# Patient Record
Sex: Female | Born: 2000 | Race: White | Hispanic: No | Marital: Single | Smoking: Never smoker
Health system: Southern US, Community
[De-identification: ages and names within clinical notes are randomized; demographics above are authoritative.]

## PROBLEM LIST (undated history)

## (undated) DIAGNOSIS — R630 Anorexia: Secondary | ICD-10-CM

## (undated) DIAGNOSIS — R001 Bradycardia, unspecified: Secondary | ICD-10-CM

## (undated) HISTORY — PX: NO PAST SURGERIES: SHX2092

---
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
## 2019-03-05 ENCOUNTER — Telehealth: Payer: Self-pay | Admitting: Family

## 2019-03-05 NOTE — Telephone Encounter (Signed)
Patient sent emails to myself as well as [name] FNP. Per Neysa Bonito, a telephone or webex visit needs to be scheduled for patient. Emailed patient back letting her know that Neysa Bonito recommends an appointment be scheduled, either via telephone or webex, which can be scheduled with me via phone or email. Requested an email back letting me know which she prefers.  -----Original Message----- From: [name] [email or web address]> Sent: Monday, March 05, 2019 9:31 AM To: Franchot Gallo [email or web address]> Subject: FW: Summer Camp Physical  I think we are running most of these communications through you  - is that what we did before?   I want to know if the camp is still on with everything going on.  Also would need forms directly from the camp.  Georges Mouse, MSN, FNP-C Adolescent Medicine Nurse Practitioner   Tim and [facility] for [facility]oles[facility] [address] [place], [place] [address] e [email or web address] p [phone] f  [phone]   -----Original Message----- From: [email or web address]> Sent: Monday, March 05, 2019 9:26 AM To: [name] [email or web address]> Subject: Summer Camp Physical  What requirements to I have to meet to pass my physical to go to camp this summer? Thank you Jacklynn Lewis

## 2019-03-06 ENCOUNTER — Telehealth: Payer: Self-pay | Admitting: Family

## 2019-03-06 NOTE — Telephone Encounter (Signed)
Mom called to schedule virtual visit. Called number on file, no answer, left VM to call office back.

## 2019-03-06 NOTE — Telephone Encounter (Signed)
Patient calling to make an appointment with [facility]. Please call her back at [phone].

## 2019-03-07 NOTE — Telephone Encounter (Signed)
Appointment made

## 2019-03-13 ENCOUNTER — Ambulatory Visit (INDEPENDENT_AMBULATORY_CARE_PROVIDER_SITE_OTHER): Payer: 59 | Admitting: Family

## 2019-03-13 ENCOUNTER — Other Ambulatory Visit: Payer: Self-pay

## 2019-03-13 ENCOUNTER — Encounter: Payer: Self-pay | Admitting: Family

## 2019-03-13 DIAGNOSIS — F5001 Anorexia nervosa, restricting type: Secondary | ICD-10-CM | POA: Diagnosis not present

## 2019-03-13 DIAGNOSIS — E440 Moderate protein-calorie malnutrition: Secondary | ICD-10-CM | POA: Diagnosis not present

## 2019-03-13 NOTE — Progress Notes (Signed)
Virtual Visit via Video Note  I connected with Angela [name] on 03/13/19 at  1:30 PM EDT by a video enabled telemedicine application and verified that I am speaking with the correct person using two identifiers.   I discussed the limitations of evaluation and management by telemedicine and the availability of in person appointments. The patient expressed understanding and agreed to proceed.  History of Present Illness: Mom: Angela [name] checked herself out after 3 days; she said she had it back. She bought a treadmill, wakes at [employer] and walks; walks around the neighborhood all the time; she eats egg whites and carrots. Mom reports that the camp is continuing for the summer as of now.   [phone] Angela [name]'s number  -didn't like being in treatment -eating more, mom has told her to eat more -feels better  -HR is in the 50s-60s usually  -never dizzy  -haven't started period, had spotting every 2-3 weeks   24 hr recall  -egg, almond milk, soy milk, almond, almond butter, almond milk, carrots, grapes, butternut squash  -not walking that much (3-4 miles/day) - is on a walk when we are on video chat    Observations/Objective: -Walking with phone and avoids eye contact with video visit  -several acne spots which looked picked at on her face  -palor   Assessment and Plan: -Advised Angela [name] to email the forms to Rowland Lathe for my review -Likely will need vitals and in-person assessment to complete   Follow Up Instructions: -I will call Angela [name] to follow up pending review of documents for summer camp   I discussed the assessment and treatment plan with the patient. The patient was provided an opportunity to ask questions and all were answered. The patient agreed with the plan and demonstrated an understanding of the instructions.   The patient was advised to call back or seek an in-person evaluation if the symptoms worsen or if the condition fails to improve as anticipated.  I provided 16  minutes of non-face-to-face time during this encounter.   Georges Mouse, NP

## 2019-03-20 ENCOUNTER — Encounter: Payer: Self-pay | Admitting: Family

## 2019-03-20 ENCOUNTER — Other Ambulatory Visit: Payer: Self-pay | Admitting: Family

## 2019-03-20 ENCOUNTER — Ambulatory Visit (INDEPENDENT_AMBULATORY_CARE_PROVIDER_SITE_OTHER): Payer: 59 | Admitting: Family

## 2019-03-20 ENCOUNTER — Other Ambulatory Visit: Payer: Self-pay

## 2019-03-20 DIAGNOSIS — F5001 Anorexia nervosa, restricting type: Secondary | ICD-10-CM

## 2019-03-20 DIAGNOSIS — F50019 Anorexia nervosa, restricting type, unspecified: Secondary | ICD-10-CM

## 2019-03-20 DIAGNOSIS — E440 Moderate protein-calorie malnutrition: Secondary | ICD-10-CM

## 2019-03-20 MED ORDER — OLANZAPINE 2.5 MG PO TABS: ORAL_TABLET | ORAL | 1 refills | Status: DC

## 2019-03-20 NOTE — Progress Notes (Signed)
Virtual Visit via Video Note  I connected with Angela [name]  on 03/20/19 at  9:00 AM EDT by a video enabled telemedicine application and verified that I am speaking with the correct person using two identifiers.   Location of patient/parent:   I discussed the limitations of evaluation and management by telemedicine and the availability of in person appointments.  I discussed that the purpose of this phone visit is to provide medical care while limiting exposure to the novel coronavirus.  The patient expressed understanding and agreed to proceed.  Reason for visit:  Disordered eating, Anorexia Nervosa - restricting type  History of Present Illness:  18 yo female with anorexia nervosa, restricting type. She left [facility] within the first few days of her arrival. She had one inpatient stay at [facility] floor for 6 days (08/08/18-08/14/18). She is    Observations/Objective:  She is on a walk and when I ask her to stop for our chat, she stops briefly then starts walking again. Of note, there is a bloody picked skin lesion between her eyebrows and one bleeding on R side of chin. Limited eye contact in the video. Blacked-out video (only audio) for the last 4 minutes of call once she returns to her home from her walk.   Assessment and Plan:  Mom called Dr. Donnie Coffin yesterday concerned that she was not eating and was running on treadmill too much  She feels she is eating and not exercising too much  She does not want to go back to hospital  She feels better than she did when she had to go to hospital before  She denies dizziness, headaches, n/v; no bingeing  Follow Up Instructions:  -Return to clinic tomorrow for labs -Reviewed SSRI and olanzapine use for DE  -She was agreeable to start olanzapine and also agreeable to have [name], LCSW call her for a therapy session    I discussed the assessment and treatment plan with the patient and/or parent/guardian. They were provided an  opportunity to ask questions and all were answered. They agreed with the plan and demonstrated an understanding of the instructions.   They were advised to call back or seek an in-person evaluation in the emergency room if the symptoms worsen or if the condition fails to improve as anticipated.  I provided 18 minutes of non-face-to-face time during this encounter. I was located in the office during this encounter.  Georges Mouse, NP

## 2019-03-21 ENCOUNTER — Encounter (HOSPITAL_COMMUNITY): Payer: Self-pay

## 2019-03-21 ENCOUNTER — Inpatient Hospital Stay (HOSPITAL_COMMUNITY)
Admission: AD | Admit: 2019-03-21 | Discharge: 2019-03-31 | DRG: 883 | Disposition: A | Discharge status: Home / Self Care | Payer: No Typology Code available for payment source | Source: Ambulatory Visit | Attending: Pediatrics | Admitting: Pediatrics

## 2019-03-21 ENCOUNTER — Encounter: Payer: Self-pay | Admitting: Family

## 2019-03-21 ENCOUNTER — Other Ambulatory Visit: Payer: Self-pay

## 2019-03-21 ENCOUNTER — Ambulatory Visit (INDEPENDENT_AMBULATORY_CARE_PROVIDER_SITE_OTHER): Payer: 59 | Admitting: Family

## 2019-03-21 VITALS — BP 83/59 | HR 59 | Ht 62.56 in | Wt 93.6 lb

## 2019-03-21 DIAGNOSIS — E440 Moderate protein-calorie malnutrition: Secondary | ICD-10-CM | POA: Diagnosis not present

## 2019-03-21 DIAGNOSIS — Q842 Other congenital malformations of hair: Secondary | ICD-10-CM

## 2019-03-21 DIAGNOSIS — F509 Eating disorder, unspecified: Secondary | ICD-10-CM | POA: Diagnosis not present

## 2019-03-21 DIAGNOSIS — F5001 Anorexia nervosa, restricting type: Secondary | ICD-10-CM | POA: Diagnosis not present

## 2019-03-21 DIAGNOSIS — Z68.41 Body mass index (BMI) pediatric, less than 5th percentile for age: Secondary | ICD-10-CM | POA: Diagnosis not present

## 2019-03-21 DIAGNOSIS — Z8659 Personal history of other mental and behavioral disorders: Secondary | ICD-10-CM

## 2019-03-21 DIAGNOSIS — E0781 Sick-euthyroid syndrome: Secondary | ICD-10-CM | POA: Diagnosis present

## 2019-03-21 DIAGNOSIS — R001 Bradycardia, unspecified: Secondary | ICD-10-CM | POA: Diagnosis not present

## 2019-03-21 DIAGNOSIS — I951 Orthostatic hypotension: Secondary | ICD-10-CM | POA: Diagnosis present

## 2019-03-21 DIAGNOSIS — D539 Nutritional anemia, unspecified: Secondary | ICD-10-CM | POA: Diagnosis present

## 2019-03-21 DIAGNOSIS — D7589 Other specified diseases of blood and blood-forming organs: Secondary | ICD-10-CM | POA: Diagnosis present

## 2019-03-21 DIAGNOSIS — D61818 Other pancytopenia: Secondary | ICD-10-CM | POA: Diagnosis present

## 2019-03-21 HISTORY — DX: Anorexia: R63.0

## 2019-03-21 HISTORY — DX: Bradycardia, unspecified: R00.1

## 2019-03-21 LAB — MAGNESIUM: Magnesium: 2.1 mg/dL (ref 1.7–2.4)

## 2019-03-21 LAB — POCT URINALYSIS DIPSTICK
Bilirubin, UA: NEGATIVE
Blood, UA: NEGATIVE
Glucose, UA: NEGATIVE
Ketones, UA: NEGATIVE
Leukocytes, UA: NEGATIVE
Nitrite, UA: NEGATIVE
Protein, UA: NEGATIVE
Spec Grav, UA: <=1.005 — AB (ref 1.010–1.025)
Urobilinogen, UA: NEGATIVE E.U./dL — AB
pH, UA: 7 (ref 5.0–8.0)

## 2019-03-21 LAB — COMPREHENSIVE METABOLIC PANEL
ALT: 39 U/L (ref 0–44)
AST: 48 U/L — ABNORMAL HIGH (ref 15–41)
Albumin: 4.4 g/dL (ref 3.5–5.0)
Alkaline Phosphatase: 55 U/L (ref 38–126)
Anion gap: 8 (ref 5–15)
BUN: 31 mg/dL — ABNORMAL HIGH (ref 6–20)
CO2: 27 mmol/L (ref 22–32)
Calcium: 9.4 mg/dL (ref 8.9–10.3)
Chloride: 104 mmol/L (ref 98–111)
Creatinine, Ser: 0.69 mg/dL (ref 0.44–1.00)
GFR calc Af Amer: >60 mL/min (ref >60)
GFR calc non Af Amer: >60 mL/min (ref >60)
Glucose, Bld: 88 mg/dL (ref 70–99)
Potassium: 4 mmol/L (ref 3.5–5.1)
Sodium: 139 mmol/L (ref 135–145)
Total Bilirubin: 0.6 mg/dL (ref 0.3–1.2)
Total Protein: 6.5 g/dL (ref 6.5–8.1)

## 2019-03-21 LAB — URINALYSIS, DIPSTICK ONLY
Bilirubin Urine: NEGATIVE
Glucose, UA: NEGATIVE mg/dL
Hgb urine dipstick: NEGATIVE
Ketones, ur: NEGATIVE mg/dL
Leukocytes,Ua: NEGATIVE
Nitrite: NEGATIVE
Protein, ur: NEGATIVE mg/dL
Specific Gravity, Urine: 1.018 (ref 1.005–1.030)
pH: 8 (ref 5.0–8.0)

## 2019-03-21 LAB — CBC WITH DIFFERENTIAL/PLATELET
Abs Immature Granulocytes: 0 10*3/uL (ref 0.00–0.07)
Basophils Absolute: 0 10*3/uL (ref 0.0–0.1)
Basophils Relative: 1 %
Eosinophils Absolute: 0.1 10*3/uL (ref 0.0–0.5)
Eosinophils Relative: 3 %
HCT: 30.6 % — ABNORMAL LOW (ref 36.0–46.0)
Hemoglobin: 10.4 g/dL — ABNORMAL LOW (ref 12.0–15.0)
Immature Granulocytes: 0 %
Lymphocytes Relative: 28 %
Lymphs Abs: 0.7 10*3/uL (ref 0.7–4.0)
MCH: 36.7 pg — ABNORMAL HIGH (ref 26.0–34.0)
MCHC: 34 g/dL (ref 30.0–36.0)
MCV: 108.1 fL — ABNORMAL HIGH (ref 80.0–100.0)
Monocytes Absolute: 0.3 10*3/uL (ref 0.1–1.0)
Monocytes Relative: 14 %
Neutro Abs: 1.4 10*3/uL — ABNORMAL LOW (ref 1.7–7.7)
Neutrophils Relative %: 54 %
Platelets: 99 10*3/uL — ABNORMAL LOW (ref 150–400)
RBC: 2.83 MIL/uL — ABNORMAL LOW (ref 3.87–5.11)
RDW: 12.9 % (ref 11.5–15.5)
WBC: 2.4 10*3/uL — ABNORMAL LOW (ref 4.0–10.5)
nRBC: 0 % (ref 0.0–0.2)

## 2019-03-21 LAB — LIPASE, BLOOD: Lipase: 39 U/L (ref 11–51)

## 2019-03-21 LAB — RAPID URINE DRUG SCREEN, HOSP PERFORMED
Amphetamines: NOT DETECTED
Barbiturates: NOT DETECTED
Benzodiazepines: NOT DETECTED
Cocaine: NOT DETECTED
Opiates: NOT DETECTED
Tetrahydrocannabinol: NOT DETECTED

## 2019-03-21 LAB — FERRITIN: Ferritin: 57 ng/mL (ref 11–307)

## 2019-03-21 LAB — TRIGLYCERIDES: Triglycerides: 52 mg/dL (ref <150)

## 2019-03-21 LAB — AMYLASE: Amylase: 78 U/L (ref 28–100)

## 2019-03-21 LAB — T4, FREE: Free T4: 0.57 ng/dL — ABNORMAL LOW (ref 0.82–1.77)

## 2019-03-21 LAB — PHOSPHORUS: Phosphorus: 2.9 mg/dL (ref 2.5–4.6)

## 2019-03-21 LAB — SEDIMENTATION RATE: Sed Rate: 9 mm/hr (ref 0–22)

## 2019-03-21 LAB — GAMMA GT: GGT: 15 U/L (ref 7–50)

## 2019-03-21 LAB — CHOLESTEROL, TOTAL: Cholesterol: 128 mg/dL (ref 0–169)

## 2019-03-21 LAB — PREGNANCY, URINE: Preg Test, Ur: NEGATIVE

## 2019-03-21 LAB — URIC ACID: Uric Acid, Serum: 2.2 mg/dL — ABNORMAL LOW (ref 2.5–7.1)

## 2019-03-21 LAB — TSH: TSH: 1.205 u[IU]/mL (ref 0.350–4.500)

## 2019-03-21 MED ORDER — ADULT MULTIVITAMIN W/MINERALS CH
1.0000 | ORAL_TABLET | Freq: Every day | ORAL | Status: DC
  Administered 2019-03-23 – 2019-03-31 (×9): 1 via ORAL
  Filled 2019-03-21 (×9): qty 1

## 2019-03-21 MED ORDER — ENSURE ENLIVE PO LIQD
237.0000 mL | ORAL | Status: DC | PRN
  Administered 2019-03-27: 18:00:00 237 mL via ORAL
  Filled 2019-03-21 (×4): qty 237

## 2019-03-21 MED ORDER — OLANZAPINE 2.5 MG PO TABS
2.5000 mg | ORAL_TABLET | Freq: Every day | ORAL | Status: DC
  Administered 2019-03-21 – 2019-03-22 (×2): 2.5 mg via ORAL
  Filled 2019-03-21 (×2): qty 1

## 2019-03-21 NOTE — Progress Notes (Signed)
History was provided by the patient.  Angela [name] is a 18 y.o. female who is here for anorexia nervosa, restricting type.   PCP confirmed? Yes.    Default, Provider, MD  HPI:   -presents for labs and vitals  -reviewed vitals with pt and hospital criteria for DE  -she agreed to start zyprexa yesterday and was planning to start it tonight -she denies any symptoms at present and is initially reluctant but agreeable to inpatient   Patient Active Problem List   Diagnosis Date Noted  . Bradycardia 08/14/2018  . Hypophosphatemia 08/09/2018  . Eating disorder 08/08/2018  . Moderate protein-calorie malnutrition (HCC) 08/08/2018  . Chronic bilateral low back pain without sciatica 02/05/2018    Current Outpatient Medications on File Prior to Visit  Medication Sig Dispense Refill  . Multiple Vitamin (MULTIVITAMIN WITH MINERALS) TABS tablet Take 1 tablet by mouth daily. 30 tablet 0  . OLANZapine (ZYPREXA) 2.5 MG tablet Take 1 tablet (2.5 mg) by mouth at bedtime for 3 nights, then increase to 2 tablets (5 mg) by mouth nightly if well tolerated. 90 tablet 1   No current facility-administered medications on file prior to visit.     No Known Allergies  Physical Exam:    Vitals:   03/21/19 1037 03/21/19 1049  BP: (!) 83/54 (!) 83/59  Pulse: (!) 44 (!) 59  Weight: 93 lb 9.6 oz (42.5 kg)   Height: 5' 2.56" (1.589 m)    Wt Readings from Last 3 Encounters:  03/21/19 93 lb 9.6 oz (42.5 kg) (<1 %, Z= -2.37)*  12/13/18 98 lb 6.4 oz (44.6 kg) (3 %, Z= -1.84)*  08/16/18 90 lb 12.8 oz (41.2 kg) (<1 %, Z= -2.63)*   * Growth percentiles are based on CDC (Girls, 2-20 Years) data.   Body mass index is 16.82 kg/m.   Blood pressure percentiles are not available for patients who are 18 years or older. No LMP recorded.  Physical Exam Vitals signs reviewed.  Constitutional:      Appearance: She is ill-appearing.     Comments: Thin habitus   HENT:     Mouth/Throat:     Mouth: Mucous  membranes are dry.  Eyes:     Extraocular Movements: Extraocular movements intact.     Pupils: Pupils are equal, round, and reactive to light.  Cardiovascular:     Rate and Rhythm: Bradycardia present.  Pulmonary:     Effort: Pulmonary effort is normal.  Skin:    General: Skin is dry.     Coloration: Skin is pale.     Comments: Lanugo present   Psychiatric:     Comments: Tearful, appropriate eye contact      Assessment/Plan: 1. Anorexia nervosa, restricting type -future orders entered, however she will admit to Peds Unit. Report given to [name], MD.  -Chester Holstein, CMA transported patient to Admissions  -Follow DE protocol  -Can return to outpatient treatment pending medical stability  -Can opt for residential treatment pending discharge and stability  -I am available for family meeting with tx team   - POCT urinalysis dipstick - CBC with Differential/Platelet - VITAMIN D 25 Hydroxy (Vit-D Deficiency, Fractures) - Tissue transglutaminase, IgA - Thyroid Panel With TSH - Sedimentation rate - Phosphorus - Magnesium - Lipase - IgA - Ferritin - Comprehensive metabolic panel - Amylase  2. Moderate protein-calorie malnutrition (HCC) -as above, BMI and vitals reviewed   3. Lanugo

## 2019-03-21 NOTE — H&P (Addendum)
Pediatric Teaching Program H&P [address], [place] [address] Phone: [phone] Fax: [phone]   Patient Details  Name: Angela [name] MRN: [id number] DOB: 2001/05/15 Age: 18 y.o.          Gender: female  Chief Complaint  Bradycardia, hypotension  History of the Present Illness  Angela [name] is a 18 y.o. female who presents with bradycardia and hypotension with a history of disordered eating.    The patient is an 18 year old female who was previously admitted to the [facility] Pediatric Floor in September 2019 for disordered eating and need for medical stabilization with nutritional rehabilitation.  After her discharge home, she returned to [employer] until the semester ended.  Since December 2019, she has been living with her mother here in [place] except for a brief 3-day stay at [facility] (inpatient eating disorder treatment facility) a couple months ago (patient checked herself out of that facility after 3 days).  While she was at [facility] after her last admission she felt comfortable with her eating habits, but states her urge to eat went 'up and down' from day to day. She said she ate at least three meals a day.  She did not exercise excessively.  She denied any form of exercise other than 'long, slow walks'.  She says she does not engage in purging behavior.  Since living with her mother she says she is eating more, because food is more easily available.  She says she has at least two snacks a day, in addition to the three meals a day she already eats.  Her favorite foods are egg whites (she can eat a carton a day she states), apples, bananas, sweet potatoes, chicken,and she likes to drink almond milk, hot tea.  She says she drinks 12 oz of almond/soy milk three times a day.  She denies any heat or cold sensitivity.  She spoke with Dr. Truddie Coco on Monday 03/19/19 at the request of her mother who was concerned about her weight loss.  She was then  seen by [name] Pinto (Adolescent medicine) today who was concerned for resting HR 44 bpm and relatively low BP (80's/50's) and requested direct admission for medical stabilization and nutritional rehabilitation.  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  She lives with her mother, and her younger sister sometimes stays with them.  She also has an older sister and a brother.  She denies alcohol/drug/tobacco use.    Review of Systems  The patient denies the following: HA, dizziness, fatigue, dyspnea on exertion, chest pain, palpitations/tachycardia, n/v/d, abdominal pain, muscle soreness, arthralgia,   Past Birth, Medical & Surgical History  Denies previous medical issues other than those associated with her previous disordered eating.  Denies any major surgeries prior to this.    Developmental History  normal  Diet History  Favorite foods are Egg whites, bananas, oranges, yogurt, chicken,  Drinks almond/soy milk, hot tea.    Family History  Pt states older sister has bulimic symptoms of purging.   Social History  Does not drink/smoke/do drugs.   Primary Care Provider  Dr. Derrill Center  Home Medications  Medication     Dose olanazpine (had not started yet) 2.62m titrated to 559mqhs         Allergies  No  Known Allergies  Immunizations  Up to date  Exam  BP (!) 88/50 (BP Location: Left Arm)    Pulse (!) 46    Temp 97.6 F (36.4 C) (Oral)    Resp 12    Ht 5' 2"  (1.575 m)    Wt 42.3 kg Comment: gown and underwear   SpO2 100%    BMI 17.06 kg/m   Weight: 42.3 kg(gown and underwear)   <1 %ile (Z= -2.41) based on CDC (Girls, 2-20 Years) weight-for-age data using vitals from 03/21/2019.  General: alert and oriented.  No acute distress. Very low body fat; able to see patient's ribs, vertebral spinous processes.   HEENT:  PERRL, EOMI, moist oral mucosa, no oropharyngeal erythema.   Neck: no thyromegaly, no lymphadenopathy.   Chest: LCTAB.  No wheezes or crackles.  Inspiratory effort mildly decreased Heart: regular rhythm. Bradycardic rate.  2+ radial pulse bilaterally.  2+ dorsalis pedis pulse bilaterally.   Abdomen: soft, nontender.  Normal bowel sounds.   Extremities: no swelling, moves all extremities spontaneously . Feet are slightly cooler to the touch.   Musculoskeletal: 5/5 strength upper and lower extremities bilaterally Neurological: CN 2-12 grossly intact. Sensation intact.   Skin: warm and dry.  No rashes but some excoriated skin over knuckles and picked at acne lesions on face  Selected Labs & Studies  UDS negative, UPT negative, UA negative (spec grav < 1.005)  Assessment  Active Problems:   Bradycardia  Angela [name] is an 18 y.o. female admitted for bradycardia and hypotension in setting of anorexia nervosa, restricting type.  During my encounter her HR was consistently in the 40-50 bpm range.  On admission her BP was 83/59. Awaiting initial admission labs. Patient denies any symptoms and believes she eats enough food, but she is voluntarily being admitted to the hospital and is willing to start taking Zyprexa to help with her nutritional rehabilitation process.  Will need to follow closely for refeeding syndrome and will follow vital signs and serial EKG's closely.  Plan   Anorexia Nervosa, restricting type - BMI 17.06.  BP 83/59, HR 46.   - consult with Dr. Hulen Skains, child psychology - consult with dietician for meal plan. Adjust meal plan according to patient needs.  - consult to social work.  - zyprexa 2.9m qhs, titrate to 523mif tolerating after three days - f/u labs: amylase, lipase, cholesterol, CMP, CBC, estradiol, ferritin, FSH, LH, GGT, IgA, mag, phos, PRL, ESR, TSH, T3, T4, triglycerides, uric acid, vit. D.  - BID labs: BMP, mag, phos - daily EKG - continuous cardiac monitor.    - 1:1 sitter,  - daily weights  - q4h BP and vitals - strict I/O - qdaily orthostatics  FENGI:regular diet.   Access:none  Dispo: - discharge pending stable electrolytes after refeeding, appropriate HR, and no orthostatic symptoms; will also need to demonstrate appropriate compliance with nutritional rehabilitation program   Interpreter present: no  DaBenay PikeMD 03/21/2019, 3:25 PM   I saw and evaluated the patient, performing the key elements of the service. I developed the management plan that is described in the resident's note, and I agree with the content with my edits included as necessary.  MaGevena MartMD 03/21/19 9:27 PM

## 2019-03-21 NOTE — Consult Note (Signed)
Consult Note  Angela [name] [name] an 18 y.o. female. MRN: [id number] DOB: [birth year]  Referring Physician: Cameron Ali.MD  Reason for Consult: Active Problems:   Bradycardia Angela [name] was referred to Pediatric Psychology as part of the Eating Disorder guidelines to assess current level of functioning and ability/willingness to participate in her care.   Evaluation: Angela [name] [name] an 18 yr old female with a history of anorexia nervosa, restricting type who was referred for admission from the adolescent clinic due to bradycardia, ill appearance, dry mucous membranes. Her mother had contacted the pediatrician and all were concerned about her ability to keep herself healthy. Angela [name] [name] a verbally responsive young adult who reviewed the eating Disorder guidelines with me along with the attending and intern. Angela [name] asked questions at times and could be somewhat demanding/insistent when Angela [name] requested something. The guidelines are posted in the room and the sitter was provided with sitter guidelines as well. Angela [name] was a patient with [place] in September 2019 and also admitted herself and discharged herself for [facility] after a two day stay.  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 Angela [name] would like to transfer to [employer] and has an interest in being a respiratory therapist.  [name] has agreed to begin Zyprexa during hospitalization. Angela [name] has agreed to the guidelines and has worked with the dietitian to order meals. Angela [name] [name] on bed-rest with bedside commode at least until tomorrow when re-assessed. Angela [name] has asked for several accommodations to the eating disorder guidelines and [name] being allowed to have a jar/bottle of mustard in her room that mother will provide. Angela [name] can also have a cup of ice, 3 times a day between meals.  [name] not suicidal/homicidal and Angela [name] has no plans to actively harm herself.  I will do a full evaluation tomorrow.   Impression/ Plan: Angela [name] a 18 yr  old female admitted for the medical management of anorexia nervosa, restricting type. At this point Angela [name] [name] participating in her eating disorder care plan.  I have coordinated her care this afternoon and will see her again tomorrow. Psychosocial/emotional support was provided to her and to her mother.   Diagnosis: anorexia nervosa, restricting type.    Time spent with patient: 55 minutes  Nelva Bush, PhD  03/21/2019 3:16 PM

## 2019-03-21 NOTE — Progress Notes (Signed)
Patient has remained calm and cooperative since admission. She was tearful with lunch due to salads having cheese and crutons and portion size not being large enough. RN discussed with RD to notify kitchen to not put cheese/crutons on salad. Meals to increase in size per protocol. Patient has had several questions, all of which have been answered by RN, RD, MD, psychologist, etc. Patient's heart rate has been in the 50-70s while awake. The lowest HR noted was 45 upon arrival to unit.

## 2019-03-21 NOTE — Progress Notes (Addendum)
Nutrition Brief Note  List of food items RD has ordered at meals:  Lunch tray to arrive at 2:15 pm. 8 ounces soy milk 1 fresh fruit cup 2 servings caesar salad  2 packets of italian dressing 1 baked sweet potato 4 slices [place] deli meat with no bread 6 packets mustard 10 oz water bottle  Dinner to arrive at 6 pm: 8 ounces of soy milk 1 banana 2 servings of salad 2 packets of italian dressing 1 baked sweet potato 4 slices of deli ham with no bread 6 packets of mustard 10 oz bottle water  4/16 : Breakfast to arrive at 7:30 am: 8 ounces of soy milk 1 apple 2 servings of rice krispies cereal 3 boiled eggs 1 cup decaf coffee 10 oz bottle water  RD full nutrition assessment note to follow.   Roslyn Smiling, MS, RD, LDN Pager # [phone] After hours/ weekend pager # [phone]

## 2019-03-21 NOTE — Progress Notes (Addendum)
   03/21/19 1600  Clinical Encounter Type  Visited With Patient and family together;Health care provider  Visit Type Initial;Social support  Spiritual Encounters  Spiritual Needs Emotional  Stress Factors  Patient Stress Factors Exhausted;Health changes;Loss of control  Family Stress Factors Loss of control   Intro visit w/ pt with her mother and NT sitter present and room door open.  Pt was willing to converse.  She lamented the lack of available groceries in stores at present, misses milk d/t unavailability.    Pt lamented that she only has one cousin and isn't close to her (several years older) cousin.  Pt did exhibit a teasing sense of humor w/ her mother.  Margretta Sidle resident, [phone]

## 2019-03-21 NOTE — Discharge Summary (Addendum)
Pediatric Teaching Program Discharge Summary [address], [place] [address] Phone: [phone] Fax: [phone]  Patient Details  Name: Angela [name] MRN: [id number] DOB: [birth year] Age: 18 y.o.          Gender: female  Admission/Discharge Information   Admit Date:  03/21/2019  Discharge Date: 03/31/2019  Length of Stay: 10   Reason(s) for Hospitalization  Bradycardia, hypotension  Problem List   Active Problems:   Eating disorder   Moderate protein-calorie malnutrition (HCC)   Bradycardia   Macrocytosis Hypotension Anorexia nervosa  Final Diagnoses  Bradycardia 2/2 anorexia nervosa  Brief Hospital Course (including significant findings and pertinent lab/radiology studies)  Angela [name] is a 18 y.o. female admitted from adolescent medicine for bradycardia below 45 bpm and hypotension < 90 SBP.  She was admitted directly to the pediatric floor.  The dietician as well as the child psychologist were consulted and met with patient upon her arrival.  A plan was developed and patient agreed to begin taking zyprexa.  EKG and electrolytes were monitored and standard eating disorder labs were obtained.  Patient's BP and HR were monitored.  An echo was ordered to evaluate for potential cardiac abnormalities related to her malnutrition and showed no abnormalities. Patient continued to have hypotension for 2 days and continued to have bradycardia to 40s, especially at night. Continued to have positive orthostatic vital signs without symptoms of feeling light headed throughout admission. Electrolytes stayed within normal limits throughout admission. She was compliant with 100% of meals completed. She gained an average of 0.1kg/day.   The patient was noted to have a pancytopenia with macrocytic anemia on CBC. The pancytopenia was most likely due to her malnutrition and the macrocytic component was most likely due to increased reticulocyte count in  response to increase nutrition. A repeat CBC was ordered on 4/20 and 4/25 which showed improved trend, with WBC 2.4>3.0>3.5; Hgb 10.4 >11.1>12.1; plt 99>112>183. She continued to have macrocytic anemia, with MCV of 110.6 on 4/25. This was presumed to also be due malnutrition and an increased reticulocyte count (3.7%); her B12 was high; folate and MMA were normal.   The patient had low T3 and T4, but normal to low-normal TSH. This was believed to be a result of her malnutrition and it was decided to not start the patient on synthroid.      At time of discharge, plan was made for her to follow with a virtual intensive outpatient program with simple nutrition. She had scheduled appointments with Hoyt Koch with adolescent medicine on 4/27 and with Evelina Dun LCSW for a virtual visit on 4/28.   Her nutrition plan at time of discharge:  Estimated Needs:  47 ml/kg 49-54 Kcal/kg 2000-2200 calories/day 1.5-2 g Protein/kg   NUTRITION DIAGNOSIS: -Malnutrition (NI-5.2) related to disordered eating as evidenced by BMI for age z-score of -2.05. Status: Ongoing  MONITORING/EVALUATION(Goals): PO intake Weight trends; goal of at least 100-200 gram gain/day Labs I/O's  INTERVENTION:  Ensure Enlive po PRN if meal completion inadequate, each supplement provides 350 kcal and 20 grams of protein.  Continue multivitamin once daily.  May allow bottle of mustard in patient's room.   May allow up to 9 cups of fluids daily. May allow 1 cup of ice chips TID. (MD may adjust as needed).  Home nutrition meal plan handouts have been given and discussed.  Breakfast at 7:30am: 8 ounces soy milk 1 apple 1 fruit cup 1 banana 2 servings of rice krispies cereal 3  boiled eggs 1 cup decaf coffee 10 oz bottle water  Mid-morning snack at 10 am: 1 orange  Lunch to arrive at noon: 8 ounces soy milk 1 fruit cup 1 cup grapes 2 servings of salad 1 packets of Balsamic vinaigrette dressing 2 servings of  cheerios cereal 2 servings of scrambled eggs 1 cup decaf coffee 10 oz bottle water  Snack at 2pm: 1 apple  Dinner to arrive at 5pm: 8 ounces soy milk 2 fruit cups 1 banana 2 servings salad 1 packet of Balsamic vinegrette dressing 9 slices deli [place] meat with no bread 6 packets mustard 1 cup decaf coffee 10 oz bottle water  Amount of Ensure Enlive to provide based on meal completion: 0-24%: 15 ounces 25%: 12 ounces 50%: 8 ounces 75-99%: 4 ounces  Procedures/Operations  None  Consultants  Child psychology Dietician  Focused Discharge Exam  Temp:  [97.3 F (36.3 C)-98.1 F (36.7 C)] 97.5 F (36.4 C) (04/25 0828) Pulse Rate:  [43-109] 109 (04/25 0828) Resp:  [12-19] 19 (04/25 0828) BP: (88-98)/(55-74) 90/74 (04/25 0828) SpO2:  [96 %-100 %] 96 % (04/25 0828) Weight:  [41 kg] 41 kg (04/25 0446) General: No apparent distress, thin/frail appearing, pleasant female CV: RRR, S1S2 present, no murmurs, rubs or gallops, current HR 80.  Pulm: Clear to auscultation b/l, comfortable WOB on room air Abd: soft, nontender to palpation, no masses, positive bowel sounds WIO:MBTD extremities, well-perfused, no injury or deformity, improved dryness to hands/knuckles  Interpreter present: no  Discharge Instructions   Discharge Weight: 41 kg   Discharge Condition: Improved  Discharge Diet: See dietary goals above, must supplement any unmet goals with Ensure  Discharge Activity: Limit activity to 5 min standing shower and short walk daily   Discharge Medication List   Allergies as of 03/31/2019   No Known Allergies     Medication List    TAKE these medications   feeding supplement (ENSURE ENLIVE) Liqd Take 237 mLs by mouth as needed (if meal goal not met).   multivitamin with minerals Tabs tablet Take 1 tablet by mouth daily.   OLANZapine 2.5 MG tablet Commonly known as:  ZYPREXA Take 1 tablet (2.5 mg total) by mouth daily. What changed:    how much to take  how  to take this  when to take this  additional instructions     Immunizations Given (date): none  Follow-up Issues and Recommendations  1. Repeat CBC with diff in 2-4 weeks to evaluate for resolution of pancytopenia. 2. Endocrinology recommends repeating Thyroid labs (TSH, T3, free T4) on or after May 1st.  Pending Results  None  Future Appointments   Follow-up Information    Parthenia Ames, NP. Go on 04/02/2019.   Specialty:  Family Medicine Why:  10:00am Contact information: [address] [address] [phone]        Kincaid,Shannon,LCSW. Go on 04/03/2019.   Why:  10:30am Contact information: Licensed Holiday representative - Video Therapy Appt        Simple nutrition will be coordinated with Hoyt Koch.   Daisy Floro, DO 03/31/2019, 10:32 AM   I personally saw and evaluated the patient, and participated in the management and treatment plan as documented in the resident's note.  Jeanella Flattery, MD 03/31/2019 4:41 PM

## 2019-03-21 NOTE — Progress Notes (Addendum)
INITIAL PEDIATRIC/NEONATAL NUTRITION ASSESSMENT Date: 03/21/2019   Time: 5:23 PM  Reason for Assessment: Eating disorder, Consult for assessment of nutrition requirements/status  ASSESSMENT: Female 18 y.o.   Admission Dx/Hx:  18 y.o. female who presents with bradycardia and hypotension with a history of anorexia nervosa.  Weight: 42.3 kg(gown and underwear)(0.8%) Length/Ht: _0  (157.5 cm) (19%) Body mass index is 17.06 kg/m. Plotted on CDC growth chart  Assessment of Growth: Pt meets criteria for MODERATE MALNUTRITION as evidenced by BMI for age z-score of -2.05.  Pt with a 5% weight loss in 3 months per weight records.   Diet/Nutrition Support: Regular diet with thin liquids.   Prior to admission usual diet recall: Breakfast: 3 cups of egg whites, 1 apple, 1 cup of soy milk Snack: 1 cup of yogurt Lunch: 2-3 ounces of deli meat (chicken, [place]), fruit, vegetables, 1 cup of soy milk Snack: fruit Dinner: 2-3 ounces of deli meat (chicken, [place]), fruit, vegetables, 1 cup of soy milk Fluids: ~72 ounce of water daily.  Estimated total calorie intake: ~1600 calories   Estimated Needs:  47 ml/kg 47-52 Kcal/kg 2000-2200 calories/day 1.5-2 g Protein/kg   Pt reports understanding of eating disorder protocol. Pt reports good appetite. Pt reports she feels she has been eating well at home, however mom at bedside disagrees. Pt expresses understanding of the importance of adequate nutrition on the body. RD to order all meals. Pt food preferences discussed. Pt reports dairy and bread causes abdominal discomfort thus avoids them in the diet. Yogurt and cereals are well tolerated. Pt requests bottle of mustard to be allowed in patient room for use during meals. Noted 1 tsp of mustard provides 5 calories. No restriction or limitation on mustard consumption per care team. Fluid recommendations stated below.   Pt meets criteria for moderate malnutrition. Pt at risk for refeeding syndrome. Meal  plan to be initiated at 1600 kcal today and will increase by 200 calories each day until full nutrition goal is met. Patient to meet full nutrition goals on Saturday 4/18.  RD to continue to monitor.   Urine Output: 250 mL  Related Meds: MVI  Labs reviewed.  IVF:    NUTRITION DIAGNOSIS: -Malnutrition (NI-5.2) related to disordered eating as evidenced by BMI for age z-score of -2.05. Status: Ongoing  MONITORING/EVALUATION(Goals): PO intake Weight trends; goal of at least 100-200 gram gain/day Labs I/O's  INTERVENTION:   Ensure Enlive po PRN if meal completion inadequate, each supplement provides 350 kcal and 20 grams of protein.   Continue multivitamin once daily.   Monitor magnesium, potassium, and phosphorus daily, MD to replete as needed, as pt is at risk for refeeding syndrome given disordered eating and malnutrition.   May allow bottle of mustard in patient's room.    May allow up to 9 cups of fluids daily. May allow 1 cup of ice chips TID. (MD may adjust as needed).  Patient to meet full nutrition goals on Saturday 4/18.  Corrin Parker, MS, RD, LDN Pager # [phone] After hours/ weekend pager # [phone]

## 2019-03-22 ENCOUNTER — Observation Stay (HOSPITAL_COMMUNITY)
Admission: AD | Admit: 2019-03-22 | Discharge: 2019-03-22 | Disposition: A | Discharge status: Home / Self Care | Payer: No Typology Code available for payment source | Source: Ambulatory Visit | Attending: Pediatrics | Admitting: Pediatrics

## 2019-03-22 DIAGNOSIS — F5001 Anorexia nervosa, restricting type: Secondary | ICD-10-CM | POA: Diagnosis present

## 2019-03-22 DIAGNOSIS — D539 Nutritional anemia, unspecified: Secondary | ICD-10-CM | POA: Diagnosis present

## 2019-03-22 DIAGNOSIS — I951 Orthostatic hypotension: Secondary | ICD-10-CM | POA: Diagnosis present

## 2019-03-22 DIAGNOSIS — R001 Bradycardia, unspecified: Secondary | ICD-10-CM | POA: Diagnosis present

## 2019-03-22 DIAGNOSIS — D61818 Other pancytopenia: Secondary | ICD-10-CM | POA: Diagnosis present

## 2019-03-22 DIAGNOSIS — Z68.41 Body mass index (BMI) pediatric, less than 5th percentile for age: Secondary | ICD-10-CM | POA: Diagnosis not present

## 2019-03-22 DIAGNOSIS — E440 Moderate protein-calorie malnutrition: Secondary | ICD-10-CM

## 2019-03-22 DIAGNOSIS — Z8659 Personal history of other mental and behavioral disorders: Secondary | ICD-10-CM | POA: Diagnosis not present

## 2019-03-22 DIAGNOSIS — E0781 Sick-euthyroid syndrome: Secondary | ICD-10-CM | POA: Diagnosis present

## 2019-03-22 DIAGNOSIS — R630 Anorexia: Secondary | ICD-10-CM

## 2019-03-22 DIAGNOSIS — D7589 Other specified diseases of blood and blood-forming organs: Secondary | ICD-10-CM | POA: Diagnosis present

## 2019-03-22 LAB — FOLLICLE STIMULATING HORMONE: FSH: 0.3 m[IU]/mL

## 2019-03-22 LAB — PHOSPHORUS
Phosphorus: 3.5 mg/dL (ref 2.5–4.6)
Phosphorus: 4 mg/dL (ref 2.5–4.6)

## 2019-03-22 LAB — LUTEINIZING HORMONE: LH: <0.2 m[IU]/mL

## 2019-03-22 LAB — IGA: IgA: 112 mg/dL (ref 87–352)

## 2019-03-22 LAB — MAGNESIUM
Magnesium: 2.2 mg/dL (ref 1.7–2.4)
Magnesium: 2.3 mg/dL (ref 1.7–2.4)

## 2019-03-22 LAB — BASIC METABOLIC PANEL
Anion gap: 10 (ref 5–15)
Anion gap: 7 (ref 5–15)
BUN: 26 mg/dL — ABNORMAL HIGH (ref 6–20)
BUN: 25 mg/dL — ABNORMAL HIGH (ref 6–20)
CO2: 26 mmol/L (ref 22–32)
CO2: 27 mmol/L (ref 22–32)
Calcium: 9.3 mg/dL (ref 8.9–10.3)
Calcium: 9.3 mg/dL (ref 8.9–10.3)
Chloride: 104 mmol/L (ref 98–111)
Chloride: 103 mmol/L (ref 98–111)
Creatinine, Ser: 0.68 mg/dL (ref 0.44–1.00)
Creatinine, Ser: 0.74 mg/dL (ref 0.44–1.00)
GFR calc Af Amer: >60 mL/min (ref >60)
GFR calc Af Amer: >60 mL/min (ref >60)
GFR calc non Af Amer: >60 mL/min (ref >60)
GFR calc non Af Amer: >60 mL/min (ref >60)
Glucose, Bld: 76 mg/dL (ref 70–99)
Glucose, Bld: 137 mg/dL — ABNORMAL HIGH (ref 70–99)
Potassium: 4 mmol/L (ref 3.5–5.1)
Potassium: 3.9 mmol/L (ref 3.5–5.1)
Sodium: 140 mmol/L (ref 135–145)
Sodium: 137 mmol/L (ref 135–145)

## 2019-03-22 LAB — GLUCOSE, CAPILLARY: Glucose-Capillary: 72 mg/dL (ref 70–99)

## 2019-03-22 LAB — VITAMIN D 25 HYDROXY (VIT D DEFICIENCY, FRACTURES): Vit D, 25-Hydroxy: 42.2 ng/mL (ref 30.0–100.0)

## 2019-03-22 LAB — T3: T3, Total: 63 ng/dL — ABNORMAL LOW (ref 71–180)

## 2019-03-22 LAB — ESTRADIOL: Estradiol: <5 pg/mL

## 2019-03-22 LAB — PROLACTIN: Prolactin: 11.5 ng/mL (ref 4.8–23.3)

## 2019-03-22 LAB — FOLATE: Folate: 30.8 ng/mL (ref >5.9)

## 2019-03-22 LAB — TISSUE TRANSGLUTAMINASE, IGA: Tissue Transglutaminase Ab, IgA: <2 U/mL (ref 0–3)

## 2019-03-22 NOTE — Progress Notes (Addendum)
FOLLOW UP PEDIATRIC/NEONATAL NUTRITION ASSESSMENT Date: 03/22/2019   Time: 10:41 AM   RD working remotely.  Reason for Assessment: Eating disorder, Consult for assessment of nutrition requirements/status  ASSESSMENT: Female 18 y.o.   Admission Dx/Hx:  18 y.o. female who presents with bradycardia and hypotension with a history of anorexia nervosa.  Weight: 40.1 kg(0.14%) Length/Ht: 5\' 2"  (157.5 cm) (19%) Body mass index is 16.17 kg/m. Plotted on CDC growth chart  Assessment of Growth: Pt meets criteria for MODERATE MALNUTRITION as evidenced by BMI for age z-score of -2.05.  Estimated Needs:  47 ml/kg 47-52 Kcal/kg 2000-2200 calories/day 1.5-2 g Protein/kg   RD contacted pt via inpatient room phone. Meal completion 50% at lunch yesterday. Meal supplemented from Ensure with full consumption. Pt ate 100% at dinner and breakfast. Pt reports having a good appetite with no abdominal discomfort. Pt with a 2200 gram weight loss since admission yesterday.   Pt meets criteria for moderate malnutrition. Pt at risk for refeeding syndrome. Meal plan to be increased to 1800 calories today. Patient to meet full nutrition goals on Saturday 4/18.  RD to continue to monitor.   Urine Output: 5.4 mL/kg/hr  Related Meds: MVI  Labs reviewed. Potassium, magnesium, phosphorous WNL.  IVF:    NUTRITION DIAGNOSIS: -Malnutrition (NI-5.2) related to disordered eating as evidenced by BMI for age z-score of -2.05. Status: Ongoing  MONITORING/EVALUATION(Goals): PO intake Weight trends; goal of at least 100-200 gram gain/day Labs I/O's  INTERVENTION:   Ensure Enlive po PRN if meal completion inadequate, each supplement provides 350 kcal and 20 grams of protein.   Continue multivitamin once daily.   Monitor magnesium, potassium, and phosphorus daily, MD to replete as needed, as pt is at risk for refeeding syndrome given disordered eating and malnutrition.   May allow bottle of mustard in  patient's room.    May allow up to 9 cups of fluids daily. May allow 1 cup of ice chips TID. (MD may adjust as needed).  Patient to meet full nutrition goals on Saturday 4/18.  Roslyn Smiling, MS, RD, LDN Pager # [phone] After hours/ weekend pager # [phone]

## 2019-03-22 NOTE — Progress Notes (Addendum)
Pediatric Teaching Program  Progress Note   Subjective  Patient states she felt fatigued last night and this morning, but that it improved after she ate.  She states she had a frontal headache this morning that also resolved after she ate.  This morning she ate eggs, cereal, apple, and decaf coffee.    Objective  Temp:  [97.6 F (36.4 C)-97.7 F (36.5 C)] 97.7 F (36.5 C) (04/16 0357) Pulse Rate:  [41-71] 41 (04/16 0357) Resp:  [10-17] 10 (04/16 0357) BP: (79-111)/(45-59) 79/46 (04/16 0357) SpO2:  [97 %-100 %] 98 % (04/16 0357) Weight:  [40.1 kg-42.5 kg] 40.1 kg (04/16 0600) General:alert and oriented.  Sitting up in bed. No acute distress.  CV: heart rate borderline bradycardic. No murmur.  Regular rhythm.  Pulm: LCTAB. No wheeze or rhonchi.  Abd: soft, nontender.  Normal bowel sounds.   Skin: warm and dry.    Labs and studies were reviewed and were significant for: Wbc 2.4, hgb 10.4, MCV 108.1, plt 99.  T3-63, T4-0.57.   ekg - sinus bradycardia  Assessment  Angela RollsCaroline M Zinger is an 18 y.o. female admitted for bradycardia and hypotension in setting of anorexia nervosa, restricting type.  continues to have HR <50, with some measured results < 40 over night along with hypotension. Dr. Lindie SpruceWyatt and dietician Roslyn SmilingStephanie Craig have developed plans for nutritional rehabilitation  We are monitoring vitals and electrolytes closely and are starting her on zyprexa.   Plan   Anorexia Nervosa, restricting type - BMI 17.06.  BP 77/41, HR 44.  Lab work from yesterday is overall normal except for pancytopenia noted on CBC and low T3/4.Marland Kitchen.  Pancytopenia and low thyroid studies almost certainly due to chronic malnutrition, but will check vitamin B12 and folate in setting of macrocytic anemia and will discuss thyroid studies with pediatric endocrinology to see if any treatment or further work up is indicated. - echo to evaluate heart function in setting of chronic malnutrition and bradycardia with no echo  performed previously - consulted with Dr. Lindie SpruceWyatt, child psychology appreciate recs  - consult with dietician for meal plan. Adjust meal plan according to patient needs. see consultant notes for most current feeding plans.  Should meet her goal caloric intake by Saturday 03/24/19 according to nutritionist. - consult to social work.  - consult to adolescent medicine, following along, appreciate all assistance - zyprexa 2.5mg  qhs, titrate to 5mg  if tolerating after three days - f/u labs: transglutaminase pe - daily EKG - continuous cardiac monitor.  HR has been mostly >50 bpm while awake and mostly >40 while sleeping.  If HR consistently <40 while asleep or any other concerning rhythm changes noted on telemetry, will transfer to PICU for closer monitoring. - 1:1 sitter,  - daily weights  - q4h BP and vitals.  BP remains low in 70's/40's; will not give IV fluids at this time since she remains adequately perfused and mentating well and IVF can cause unneccessary fluid shifts and cardiac strain, but will monitor very closely and not allow her to have out of bed privileges until BP has improved - strict I/O - qdaily orthostatics  Pancytopenia - wbc 2.4, Hgb 10.4 (MCV 108.1), plt 99.  Likely from malnutrition.   - f/u B12 and folate -  Repeat CBC on 03/26/19 to evaluate trend in setting of pancytopenia (which is almost certainly caused by chronic malnutrition).  If pancytopenia is worsening rather than improving after replenishment of calories, will need to consult Hematology for further recommendations to  rule out concern for aplastic anemia or other serious etiologies such as malignancy.   Access:none  Family meeting scheduled for Monday April 03/26/19 to include patient, attending physician and resident physician, Dr. Lindie Spruce (Child Pyschology), [name] (Adolescent Medicine), and CSW [facility]. Will discuss patient's progress at that time, goals of care, and anticipated plans after  discharge.  Interpreter present: no   LOS: 0 days   Sandre Kitty, MD 03/22/2019, 7:07 AM   Maren Reamer, MD  03/22/19 4:11 PM

## 2019-03-22 NOTE — Progress Notes (Signed)
Larenda continues to be bradycardiac with the lowest recorded HR being 35 bpm while asleep but came back up to the 40's. Pt is on bedrest with bedside commode privileges. Pt was very talkative and appropriate with RN. Resp rate around 10-11 when asleep. At 5a pt stated to this RN she felt very tired and fatigue didn't feel like herself. Denies pain. RN checked her blood sugar she was 72. Pt had a weight loss this am. I did let Resident doctors know Sitter at bedside.

## 2019-03-22 NOTE — Progress Notes (Signed)
CSW consult acknowledged for this 18 year old with history of eating disorder. Patient known to this CSW from previous admission. Pediatric psychologist following. CSW joined family care meeting this morning in discussion about plans for treatment here and disposition. CSW requested to prepare resource list of Eating Disorders treatment facilities. Family meeting set tentatively for 4/20.   CSW prepared Eating Disorders treatment list. Will continue to follow, assist as needed.   Gerrie Nordmann, LCSW [phone]

## 2019-03-22 NOTE — Progress Notes (Signed)
   03/22/19 1200  Clinical Encounter Type  Visited With Patient and family together;Patient;Health care provider  Visit Type Follow-up;Social support;Spiritual support  Referral From Other (Comment) (Pediatric psychologist)  Spiritual Encounters  Spiritual Needs Emotional  Stress Factors  Patient Stress Factors Loss of control;Other (Comment);Family relationships (bored/restless)  Family Stress Factors Loss of control   Met w/ pt w/ sitter present and door open entire time.  Pt's mother present for about first half of visit.  Med resident in briefly as well as person to draw blood.  Pt was interactive, w/ brief glances at phone.  Humorously sarcastic about managing to "fit all this sitting in."  We chatted about her siblings, childhood pets, her stuffed Pooh bear which her mother gave her at birth, favorite cereal (Cheerios, wants to try [employer]).  Had her Bible on bedside stand, spoke about how she has journalled in it.  Talked about college some, career thoughts.  Ok w/ visit later, is open to coloring.    Myra Gianotti resident, [phone]

## 2019-03-22 NOTE — Progress Notes (Addendum)
Nutrition Brief Note  List of food items RD has ordered at meals:  4/16: Lunch to arrive at noon. 8 ounces soy milk 1 fresh fruit cup 1 banana 1 serving of salad  1 packet of italian dressing 1 baked sweet potato 5 slices [place] deli meat with no bread 6 packets mustard 10 oz water bottle  Snack at arrive at 2 pm: 1 apple  Dinner to arrive at 5 pm: 8 ounces of soy milk 2 servings of fruit cup 2 servings of salad 2 packets of italian dressing 1 baked sweet potato 5 slices of deli ham with no bread 6 packets of mustard 10 oz bottle water  4/17: Breakfast to arrive at 7:30 am: 8 ounces of soy milk 1 apple 2 servings of cheerioscereal 4 boiled eggs 1 cup decaf coffee 10 oz bottle water  Roslyn Smiling, MS, RD, LDN Pager # [phone] After hours/ weekend pager # [phone]

## 2019-03-22 NOTE — Progress Notes (Addendum)
Progress Note  Angela [name] is an 18 y.o. female. MRN: [id number] DOB: 08-Mar-2001  Referring Physician: Cameron Ali, MD  Reason for Consult: Active Problems:   Bradycardia   Evaluation: Angela [name] is a 18 yr old female with a history of anorexia nervosa, restricting type admitted with bradycardia. Today she smiled as she greeted me. She said she felt "tired" this morning and also felt "let down" but was unable to elaborate on her feelings. She wondered aloud if it might be the effect of the medication. I also wondered if it could be the change from home to hospital setting. She is willing to continue to the medications.  Angela [name] and her mother asked if she was still bradycardic. I reviewed that she was both hypotensive and bradycardic and would be on bed rest today with bedside commode use as well. Mother had questions about blood tests so I will let the doctor address these.  Angela [name] filed me on her recent history. She did complete the first semester of her freshman year at [employer] but then dropped out. She cam home to [facility] after completing her semester classwork. She was at [facility] for several days in January but then checked herself out. She is living with her mother and her 78 yr old sister is living with the father.  When I left Angela [name] was ordering meals with the dietitian and wanted to see if she could have an afternoon snack added to her plan.  I will ask for both a chaplain re-visit and a recreation therapy visit to provide both company and support and activities.   Impression/ Plan: Angela [name] is a 18 yr old admitted with bradycardia. She is complying with the eating disorder guidelines, ordering through the dietitian and eating her meals. I plan to see her again tomorrow.   Diagnosis: anorexia nervosa, restricting type.  Time spent with patient: 18 minutes  Nelva Bush, PhD  03/22/2019 10:22 AM

## 2019-03-22 NOTE — Treatment Plan (Signed)
I spoke with Dr. 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 He believes that this may be related to her malnourished state, but will look into the literature to see if there is any published data on thyroid dysregulation with malnutrition and if replacement therapy is indicated. He will let us know later in the week. He reports that TFTs should be checked at least one month after discharge to evaluate for potential underlying thyroid disorder.  Cori Razor, MD Pediatrics, PGY-2

## 2019-03-22 NOTE — Progress Notes (Signed)
Family Care Conference     Blenda Peals, Social Worker     Lequita Halt, Chiropodist    T. Haithcox, Director    N. Ermalinda [facility]    A. Lanice Schwab Resident     Attending: Margo Aye Nurse: Lanora Manis of Care: 18 year old with history of anorexia admitted due to bradycardia and hypotension. Previous inpatient treatment here 09/19. Recent 3 day stay at [facility] Eating Disorders treatment. Pediatric psychologist, nutrition, CSW consulted. Potential family meeting 4/20.  Gerrie Nordmann, LCSW [phone]

## 2019-03-23 LAB — BASIC METABOLIC PANEL
Anion gap: 7 (ref 5–15)
Anion gap: 10 (ref 5–15)
BUN: 24 mg/dL — ABNORMAL HIGH (ref 6–20)
BUN: 21 mg/dL — ABNORMAL HIGH (ref 6–20)
CO2: 30 mmol/L (ref 22–32)
CO2: 26 mmol/L (ref 22–32)
Calcium: 9.1 mg/dL (ref 8.9–10.3)
Calcium: 9.6 mg/dL (ref 8.9–10.3)
Chloride: 102 mmol/L (ref 98–111)
Chloride: 105 mmol/L (ref 98–111)
Creatinine, Ser: 0.74 mg/dL (ref 0.44–1.00)
Creatinine, Ser: 0.61 mg/dL (ref 0.44–1.00)
GFR calc Af Amer: >60 mL/min (ref >60)
GFR calc Af Amer: >60 mL/min (ref >60)
GFR calc non Af Amer: >60 mL/min (ref >60)
GFR calc non Af Amer: >60 mL/min (ref >60)
Glucose, Bld: 96 mg/dL (ref 70–99)
Glucose, Bld: 69 mg/dL — ABNORMAL LOW (ref 70–99)
Potassium: 3.7 mmol/L (ref 3.5–5.1)
Potassium: 4 mmol/L (ref 3.5–5.1)
Sodium: 139 mmol/L (ref 135–145)
Sodium: 141 mmol/L (ref 135–145)

## 2019-03-23 LAB — MAGNESIUM
Magnesium: 2.3 mg/dL (ref 1.7–2.4)
Magnesium: 2.5 mg/dL — ABNORMAL HIGH (ref 1.7–2.4)

## 2019-03-23 LAB — PHOSPHORUS
Phosphorus: 3.9 mg/dL (ref 2.5–4.6)
Phosphorus: 4.8 mg/dL — ABNORMAL HIGH (ref 2.5–4.6)

## 2019-03-23 LAB — VITAMIN B12: Vitamin B-12: 2932 pg/mL — ABNORMAL HIGH (ref 180–914)

## 2019-03-23 LAB — T4, FREE: Free T4: 0.55 ng/dL — ABNORMAL LOW (ref 0.82–1.77)

## 2019-03-23 LAB — TSH: TSH: 4.85 u[IU]/mL — ABNORMAL HIGH (ref 0.350–4.500)

## 2019-03-23 MED ORDER — OLANZAPINE 2.5 MG PO TABS
2.5000 mg | ORAL_TABLET | Freq: Every day | ORAL | Status: DC
  Filled 2019-03-23: qty 1

## 2019-03-23 MED ORDER — OLANZAPINE 10 MG PO TABS: 5.0000 mg | ORAL_TABLET | Freq: Every day | ORAL | Status: DC

## 2019-03-23 MED ORDER — OLANZAPINE 2.5 MG PO TABS
2.5000 mg | ORAL_TABLET | Freq: Every day | ORAL | Status: DC
  Administered 2019-03-23 – 2019-03-25 (×3): 2.5 mg via ORAL
  Filled 2019-03-23 (×4): qty 1

## 2019-03-23 NOTE — Progress Notes (Addendum)
Pediatric Teaching Program  Progress Note   Subjective  Patient states she feels fine today.  She did not have a headache or fatigue prior to breakfast.  She ate her whole breakfast, which was the same as yesterday. She had some 'flushing' of her face when she stood up during her orthostatic vitals.     Objective  Temp:  [97.4 F (36.3 C)-98.4 F (36.9 C)] 97.8 F (36.6 C) (04/17 0356) Pulse Rate:  [38-129] 41 (04/17 0600) Resp:  [11-25] 13 (04/17 0600) BP: (68-84)/(32-53) 68/32 (04/17 0500) SpO2:  [94 %-100 %] 99 % (04/17 0600) Weight:  [39.7 kg] 39.7 kg (04/17 0514) General:alert and oriented.  Very thin body habitus.  No acute distress. Sitting up in bed.  CV: regular rhythm.  Bradycardic rate. No murmurs.  2+ pulses b/l.   Pulm: LCTAB.  No wheeze. No crackles. No increased WOB.  Abd: soft, nontender. Normal bowel sounds.  Skin: warm and dry.  Extremities: no pitting edema  Labs and studies were reviewed and were significant for: Echo: normal function TSH 4.850 Free T4: 0.55 Free T3 and reverse T3 pending  Assessment  Angela [name] an17 y.o.femaleadmitted for bradycardia and hypotensionin setting of anorexia nervosa, restricting type. Continues to have HR in the high 30s to low 40s overnight, and hypotension with MAPs in the 40s at times (BP as low as 68/32 early this morning, but was mentating appropriately and well-perfused at that time). Echo was normal. Dr. Lindie Spruce and dietician Roslyn Smiling have developed plans for nutritional rehabilitation. We are monitoring vitals and electrolytes for signs of refeeding syndrome, which continue to be normal and have started her on zyprexa.   Plan  Anorexia Nervosa, restricting type- BMI 16.01 and trending down.. BP 85/58, HR 66.  Pancytopenia and low thyroid studies almost certainly due to chronic malnutrition, folate normal, B12 pending. More thyroid studies pending, per Dr. Thana [name] request.  - consulted with Dr.  Lindie Spruce, child psychology appreciate recs            - consult with dietician for meal plan. Adjust meal plan according to patient needs. see consultant notes for most current feeding plans.  Should meet her goal caloric intake by Saturday 03/24/19 according to nutritionist. - consult to social work, regarding outpatient treatment centers. .  - consult to adolescent medicine, following along, appreciate all assistance - zyprexa 2.5mg  qhs, plan had been to titrate to 5mg  if tolerating after three days, however Angela [name] is resistant to this increase; have discussed with Adolescent Medicine and will leave dose at 2.5 mg QHS for now - f/u labs: B12 - continue checking electrolytes BID; K+ trending downward but still in normal range; can decrease to daily BMPs when deemed clinically appropriate - daily EKG - continuous cardiac monitor. was consistently in the high 30s to low 40s last night.  If HR consistently <40 while asleep (and does not increase to >40 when awoken or after consuming calories) or any other concerning rhythm changes noted on telemetry, will transfer to PICU for closer monitoring. - 1:1 sitter,  - daily weights  - q4h BP and vitals.  BP remains low in 70's/40's, low of 68/32 last night but she is not showing any signs of end-organ damage and only one instance of orthostatic effects.; will not give IV fluids at this time since she remains adequately perfused and mentating well and IVF can cause unneccessary fluid shifts and cardiac strain, but will monitor very closely and not allow her to have  out of bed privileges until BP has improved.  Per Adolescent Medicine recommendations, will also add Gatorade 8 oz per day PRN to see if any improvement in BP. - strict I/O - qdaily orthostatics - strict bed rest and bedside commode.  - family meeting scheduled for 1230pm on Monday with patient, her mother, medical team, Adolescent medicine, Dr. Lindie [name], Angela [name] (CSW) and nutritionist Angela [name]; will  discuss progress, goals for discharge, and post-discharge plans (ie. Intensive outpatient regimen vs. Inpatient eating disorders unit)  Pancytopenia - wbc 2.4, Hgb 10.4 (MCV 108.1), plt 99.  Likely from malnutrition.   - f/u B12 . Folate normal.  -  Repeat CBC on 03/26/19 to evaluate trend in setting of pancytopenia (which is almost certainly caused by chronic malnutrition).  If pancytopenia is worsening rather than improving after replenishment of calories, will need to consult Hematology for further recommendations to rule out concern for aplastic anemia or other serious etiologies such as malignancy.  Abnormal Thyroid Studies - initial TFTs consistent with sick euthyroid; today TSH 0.55 and free T4 4.850 but still likely related to suppression of hypothalamic-pituitary axis; have discussed with Adolescent Medicine who would like to hold off on treatment for now and monitor levels for improvement as caloric intake improves  Interpreter present: no   LOS: 1 day   Angela Kittyaniel K Olson, MD 03/23/2019, 6:41 AM  I saw and evaluated the patient, performing the key elements of the service. I developed the management plan that is described in the resident's note, and I agree with the content with my edits included as necessary.  Angela ReamerMargaret S Orvill Coulthard, MD 03/23/19 9:55 PM

## 2019-03-23 NOTE — Progress Notes (Addendum)
Anjum was sitting up in bed, smiling and eager to chat. We reviewed that she is on strict bed rest as her heart rate and blood pressures are still low. This means she is not to get out of bed for anything other than using bedside commode and being weighed. She should stay in bed to put her gown on in the morning.  Erricka asked if her mother can eat meals with her and this was discussed in rounds and approved. Khanya will call her mother and let her know meal times: 7:30 am, 12 noon, 5:00 pm. She also asked if she could take her gummy vitamins from home instead of hospital vitamin tablets. I told her we prefer that she take the vitamins from the hospital as this time. She can resume the gummy vitamins when discharged.  Family/team meeting planned for Monday at 12:30 pm.  Time 20 minutes

## 2019-03-23 NOTE — Progress Notes (Addendum)
End of shift note: Patient was cooperative/calm and talkative during entire shift.  She continues to be bradycardiac. Patient had a weight loss this am.  Patient expressed on shift that she felt weak and that she felt dizzy when standing for orthostatic pressures.  Patient also expressed she was hungry around 0600 after talking/chatting about various life subjects.

## 2019-03-23 NOTE — Progress Notes (Signed)
   03/23/19 1700  Clinical Encounter Type  Visited With Patient;Health care provider  Visit Type Follow-up;Social support  Spiritual Encounters  Spiritual Needs Emotional  Stress Factors  Patient Stress Factors Loss of control;Health changes   Another f/u visit w/ pt.  NT sitter present for start of visit, stepped away to nurses' station, outside door when I went to leave and so NT returned for seamless presence w/ pt.  Door was open entire visit.  Chaplain previously cleared coloring w/ crayons w/ Dr. Lindie Spruce, brought coloring sheets and crayons to rm and also rcvd ok from care RN Archie Patten to let those remain in rm w/ pt.    Colored and chatted w/ pt about a range of topics.  Margretta Sidle resident, [phone]

## 2019-03-23 NOTE — Progress Notes (Signed)
FOLLOW UP PEDIATRIC/NEONATAL NUTRITION ASSESSMENT Date: 03/23/2019   Time: 10:35 AM   RD working remotely.  Reason for Assessment: Eating disorder, Consult for assessment of nutrition requirements/status  ASSESSMENT: Female 18 y.o.   Admission Dx/Hx:  18 y.o. female who presents with bradycardia and hypotension with a history of anorexia nervosa.  Weight: 39.7 kg(0.09%) Length/Ht: 5\' 2"  (157.5 cm) (19%) Body mass index is 16.01 kg/m. Plotted on CDC growth chart  Assessment of Growth: Pt meets criteria for MODERATE MALNUTRITION as evidenced by BMI for age z-score of -2.05.  Estimated Needs:  47 ml/kg 47-52 Kcal/kg 2000-2200 calories/day 1.5-2 g Protein/kg   RD contacted pt via inpatient room phone. Meal completion has been 100%. Pt reports having a good appetite with no abdominal discomfort. Pt with a 400 gram weight loss since admission yesterday.   Pt meets criteria for moderate malnutrition. Pt at risk for refeeding syndrome. Meal plan to be increased to 2000 calories today. RD to order all meals throughout the weekend.   RD to continue to monitor.   Urine Output: 5.4 mL/kg/hr  Related Meds: MVI  Labs reviewed. Potassium, magnesium, phosphorous WNL.  IVF:    NUTRITION DIAGNOSIS: -Malnutrition (NI-5.2) related to disordered eating as evidenced by BMI for age z-score of -2.05. Status: Ongoing  MONITORING/EVALUATION(Goals): PO intake Weight trends; goal of at least 100-200 gram gain/day Labs I/O's  INTERVENTION:   Ensure Enlive po PRN if meal completion inadequate, each supplement provides 350 kcal and 20 grams of protein.   Continue multivitamin once daily.   Monitor magnesium, potassium, and phosphorus daily, MD to replete as needed, as pt is at risk for refeeding syndrome given disordered eating and malnutrition.   May allow bottle of mustard in patient's room.    May allow up to 9 cups of fluids daily. May allow 1 cup of ice chips TID. (MD may adjust  as needed).  Patient to meet full nutrition goals on Saturday 4/18.  Roslyn Smiling, MS, RD, LDN Pager # [phone] After hours/ weekend pager # [phone]

## 2019-03-23 NOTE — Progress Notes (Addendum)
Nutrition Brief Note  List of food items RD has ordered at meals:  Friday 4/17: Lunch to arrive at noon. 8 ounces soy milk 1 banana 2 serving of salad  2 packet of italian dressing 2 baked sweet potato 6 slices [place] deli meat with no bread 6 packets mustard 1 cup decaf coffee 10 oz water bottle  Snack to arrive at 2 pm: 1 apple  Dinner to arrive at 5 pm: 8 ounces of soy milk 2 servings of fruit cup 2 servings of salad 2 packets of italian dressing 2 baked sweet potato 6 slices of deli ham with no bread 6 packets of mustard 1 cup decaf coffee 10 oz bottle water  Saturday 4/18: Breakfast to arrive at 7:30 am: 8 ounces of soy milk 1 apple 1 banana 2 servings of cheerios cereal 4 boiled eggs 1 cup decaf coffee 10 oz bottle water  Lunch to arrive at noon: 8 ounces soy milk 1 fruit cup 1 banana 2 servings of salad 2 packets italian dressing 2  Baked sweet potatoes 6 slices of [place] deli meal with no bread 6 packets of mustard 10 oz bottle water  Snack to arrive at 2 pm: 1 apple  Dinner to arrive at 5 pm: 8 ounces soy milk 1 banana 1 apple 2 servings of salad 2 packets of italian dressing 2 baked sweet potatoes 6 slices of deli ham 6 packets of mustard 1 cup decaf coffee 10 oz bottle water  Roslyn Smiling, MS, RD, LDN Pager # [phone] After hours/ weekend pager # [phone]

## 2019-03-24 ENCOUNTER — Other Ambulatory Visit: Payer: Self-pay

## 2019-03-24 ENCOUNTER — Encounter (HOSPITAL_COMMUNITY): Payer: Self-pay | Admitting: *Deleted

## 2019-03-24 LAB — METHYLMALONIC ACID, SERUM: Methylmalonic Acid, Quantitative: 113 nmol/L (ref 0–378)

## 2019-03-24 LAB — URINALYSIS, DIPSTICK ONLY
Bilirubin Urine: NEGATIVE
Glucose, UA: NEGATIVE mg/dL
Hgb urine dipstick: NEGATIVE
Ketones, ur: NEGATIVE mg/dL
Leukocytes,Ua: NEGATIVE
Nitrite: NEGATIVE
Protein, ur: NEGATIVE mg/dL
Specific Gravity, Urine: 1.01 (ref 1.005–1.030)
pH: 8 (ref 5.0–8.0)

## 2019-03-24 LAB — BASIC METABOLIC PANEL
Anion gap: 8 (ref 5–15)
Anion gap: 12 (ref 5–15)
BUN: 18 mg/dL (ref 6–20)
BUN: 21 mg/dL — ABNORMAL HIGH (ref 6–20)
CO2: 30 mmol/L (ref 22–32)
CO2: 25 mmol/L (ref 22–32)
Calcium: 9.2 mg/dL (ref 8.9–10.3)
Calcium: 9.4 mg/dL (ref 8.9–10.3)
Chloride: 102 mmol/L (ref 98–111)
Chloride: 102 mmol/L (ref 98–111)
Creatinine, Ser: 0.66 mg/dL (ref 0.44–1.00)
Creatinine, Ser: 0.76 mg/dL (ref 0.44–1.00)
GFR calc Af Amer: >60 mL/min (ref >60)
GFR calc Af Amer: >60 mL/min (ref >60)
GFR calc non Af Amer: >60 mL/min (ref >60)
GFR calc non Af Amer: >60 mL/min (ref >60)
Glucose, Bld: 76 mg/dL (ref 70–99)
Glucose, Bld: 142 mg/dL — ABNORMAL HIGH (ref 70–99)
Potassium: 3.6 mmol/L (ref 3.5–5.1)
Potassium: 4 mmol/L (ref 3.5–5.1)
Sodium: 140 mmol/L (ref 135–145)
Sodium: 139 mmol/L (ref 135–145)

## 2019-03-24 LAB — MAGNESIUM
Magnesium: 2.2 mg/dL (ref 1.7–2.4)
Magnesium: 2.1 mg/dL (ref 1.7–2.4)

## 2019-03-24 LAB — PHOSPHORUS
Phosphorus: 3.5 mg/dL (ref 2.5–4.6)
Phosphorus: 4 mg/dL (ref 2.5–4.6)

## 2019-03-24 LAB — T3, FREE: T3, Free: 1.5 pg/mL — ABNORMAL LOW (ref 2.3–5.0)

## 2019-03-24 MED ORDER — ONDANSETRON HCL 4 MG/5ML PO SOLN
4.0000 mg | Freq: Once | ORAL | Status: DC
  Filled 2019-03-24: qty 5

## 2019-03-24 NOTE — Progress Notes (Addendum)
Nutrition Brief Note  RD working remotely.  List of food items RD has ordered at meals for tomorrow:  Sunday 4/19:  Breakfast to arrive at 7:30 am: 8 ounces soy milk 1 apple 1 fruit cup 2 servings rice krispies 4 boiled eggs 1 cup decaf coffee 10 oz bottle water  Lunch to arrive at noon: 8 ounces soy milk 2 fruit cups 2 servings of salad 2 packets of italian dressing 2 baked sweet potatoes 6 slices [place] deli with no bread 6 packets mustard 10 oz bottle water  Snack to arrive at 2pm: 1 apple  Dinner to arrive at 5pm: 8 ounces soy milk 1 banana 2 servings salad 2 packets of italian dressing 2 baked sweet potatoes 6 slices of deli ham with no bread 6 packets mustard 1 cup decaf coffee 10 oz bottle water  Roslyn Smiling, MS, RD, LDN After hours/ weekend pager # [phone]

## 2019-03-24 NOTE — Progress Notes (Addendum)
Pediatric Teaching Program  Progress Note   Subjective  She continues to have bradycardia, down to HR 46 overnight, overall HR running >55.  Overnight she expressed that she would like to speak to the dietician about making changes to her feeding plan. She says that she would like to be compliant with her nutrition plan, but she gets really excessively full toward the end of a meal, and then finds herself getting hungry before the next mealtime. She would rather space things out and have a snack if possible. So far she has been able to finish meals.  Objective  Temp:  [97.1 F (36.2 C)-98.6 F (37 C)] 98.4 F (36.9 C) (04/18 0600) Pulse Rate:  [46-137] 56 (04/18 0500) Resp:  [11-20] 19 (04/18 0500) BP: (85-95)/(58-66) 90/59 (04/18 0432) SpO2:  [92 %-100 %] 98 % (04/18 0500) Weight:  [39.9 kg] 39.9 kg (04/18 0543) General:Thin female, sitting up in bed, pleasant, conversant HEENT: MMM, neck supple, thyroid nonpalpable  CV: HR~55, nml S1/S2, no murmur/rubs/gallops, radial pulse 2+, CR<2s Pulm: Lungs clear b/l, comfortable WOB Abd: thin, slightly scaphoid, soft, nontender Skin: WWP, pink, no rashes Ext: Moving all extremities equally, no edema  Labs and studies were reviewed and were significant for: Component     Latest Ref Rng & Units 03/24/2019  Sodium     135 - 145 mmol/L 140  Potassium     3.5 - 5.1 mmol/L 3.6  Chloride     98 - 111 mmol/L 102  CO2     22 - 32 mmol/L 30  Glucose     70 - 99 mg/dL 76  BUN     6 - 20 mg/dL 18  Creatinine     2.63 - 1.00 mg/dL 3.35  Calcium     8.9 - 10.3 mg/dL 9.2  GFR, Est Non African American     >60 mL/min >60  GFR, Est African American     >60 mL/min >60  Anion gap     5 - 15 8  Color, Urine     YELLOW STRAW (A)  Appearance     CLEAR CLEAR  Specific Gravity, Urine     1.005 - 1.030 1.010  pH     5.0 - 8.0 8.0  Glucose, UA     NEGATIVE mg/dL NEGATIVE  Hgb urine dipstick     NEGATIVE NEGATIVE  Bilirubin Urine  NEGATIVE NEGATIVE  Ketones, ur     NEGATIVE mg/dL NEGATIVE  Protein     NEGATIVE mg/dL NEGATIVE  Nitrite     NEGATIVE NEGATIVE  Leukocytes,Ua     NEGATIVE NEGATIVE  Magnesium     1.7 - 2.4 mg/dL 2.2  Phosphorus     2.5 - 4.6 mg/dL 3.5    EKG: bradycardia, HR36, QRS 52ms   Assessment  Angela [name] is a 18 y.o. female admitted for admitted for bradycardia and hypotensionin setting of anorexia nervosa, restricting type.Continues to have HR in the high 30s to low 40s overnight, however reassuring her hypotension has resolved, echo was normal, and her HR is mostly running >50. Her electrolytes were WNL today, including K, Mag, and Phos. She still requires close monitoring for refeeding syndrome, first started eating meals on 4/15. She gained 0.2kg since yesterday, but weight overall is still down from admission.   She was also found to have pancytopenia including macrocytic anemia on admission, Hgb 10.4 MCV 108, nml RDW 12.9. Given her restricted eating, would have presumed to be vitamin related, however,  her B12 was high, methylmalonic acid and folate were normal. Potentially related to reticulocytosis, will recheck on Mon 4/20.    Plan  Anorexia Nervosa, restricting type- - consultedwith Dr. Lindie SpruceWyatt, child psychology appreciate recs - consult with dietician for meal plan,see consultant notes for most currentfeedingplans.Should be meeting her goal caloric intake by today 03/24/19 according to nutritionist. - consult to social work, regarding outpatient treatment centerss - consult to adolescent medicine, following along, appreciate all assistance - zyprexa 2.5mg  qhs, plan had been to titrate to 5mg  if tolerating after three days, however Angela [name] is resistant to this increase; have discussed with Adolescent Medicine and will leave dose at 2.5 mg QHS for now - continue checking electrolytes BID; can decrease to daily BMPs when deemed clinically appropriate - daily  EKG - continuous cardiac monitor.HR improved to mostly >50, but was as low as upper 30s overnight. If HR consistently <40 while asleep (and does not increase to >40 when awoken or after consuming calories) or any other concerning rhythm changes noted on telemetry, will transfer to PICU for closer monitoring. - 1:1 sitter - daily weights  - q4h BP and vitals.  Per Adolescent Medicine recommendations, will also add Gatorade 8 oz per day PRN to see if any improvement in BP. - strict I/O - qdaily orthostatics - strict bed rest and bedside commode.  - family meeting scheduled for 1230pm on Monday 4/20 with patient, her mother, medical team, Adolescent medicine, Dr. Lindie SpruceWyatt, Marcelino DusterMichelle (CSW) and nutritionist Judeth CornfieldStephanie; will discuss progress, goals for discharge, and post-discharge plans (ie. Intensive outpatient regimen vs. Inpatient eating disorders unit)  Pancytopenia - wbc 2.4, Hgb 10.4 (MCV 108.1), plt 99. Likely from malnutrition, however B12 elevated and Folate normal.  -Repeat CBC on 03/26/19 to evaluate trend of pancytopenia.If pancytopenia or macrocytic anemia is worsening rather than improving after replenishment of calories, consider consulting Hematology for further recommendations.   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  Consuella LoseAKINTEMI, Edye Hainline-KUNLE B, MD 03/24/2019 5:07 PM

## 2019-03-24 NOTE — Progress Notes (Signed)
Pt resting well this evening. Her mom stayed until bedtime. Sitter remains at bedside. Pt expressed her feelings about eating so much food in such a little time-30 minutes. She wants to speak to RD to see if she can spread food out throughout the day. Pt is very anxious about this for the weekend. RN told resident doctors abt the situation and they did go to talk with her. Pt had a weight gain today

## 2019-03-25 ENCOUNTER — Other Ambulatory Visit: Payer: Self-pay

## 2019-03-25 LAB — BASIC METABOLIC PANEL
Anion gap: 5 (ref 5–15)
BUN: 18 mg/dL (ref 6–20)
CO2: 28 mmol/L (ref 22–32)
Calcium: 9.2 mg/dL (ref 8.9–10.3)
Chloride: 104 mmol/L (ref 98–111)
Creatinine, Ser: 0.58 mg/dL (ref 0.44–1.00)
GFR calc Af Amer: >60 mL/min (ref >60)
GFR calc non Af Amer: >60 mL/min (ref >60)
Glucose, Bld: 77 mg/dL (ref 70–99)
Potassium: 3.9 mmol/L (ref 3.5–5.1)
Sodium: 137 mmol/L (ref 135–145)

## 2019-03-25 LAB — MAGNESIUM: Magnesium: 2.2 mg/dL (ref 1.7–2.4)

## 2019-03-25 LAB — PHOSPHORUS: Phosphorus: 3.4 mg/dL (ref 2.5–4.6)

## 2019-03-25 NOTE — Progress Notes (Signed)
Pediatric Teaching Program  Progress Note   Subjective  No acute events overnight. Still feeling extremely full after meals to the point of being sick and then not hungry for next meal. She would like to discuss with the nutritionist regarding her concerns.  Objective  Temp:  [97.6 F (36.4 C)-98.1 F (36.7 C)] 97.7 F (36.5 C) (04/19 1200) Pulse Rate:  [46-88] 55 (04/19 1200) Resp:  [16-22] 22 (04/19 1200) BP: (84-95)/(49-64) 90/60 (04/19 0726) SpO2:  [96 %-100 %] 98 % (04/19 1200) Weight:  [40.5 kg] 40.5 kg (04/19 0520)  General: Alert, well-appearing female in NAD, sitting up in bed online shopping.  HEENT:  Normocephalic, atraumatic, no eye or nasal discharge, MMM Cardiovascular: Regular rate and rhythm, S1 and S2 normal. No murmur, rub, or gallop appreciated. Radial pulse +2 bilaterally. Cap refill < 2 sec. Pulmonary: Normal work of breathing. Clear to auscultation bilaterally with no wheezes or crackles present Abdomen: Normoactive bowel sounds. Soft, non-tender, non-distended.  Extremities: Warm and well-perfused Neurologic: No focal deficits Skin: No rashes or lesions.  Labs and studies were reviewed and were significant for: CHEM 10: WNL EKG: Sinus bradycardia  Assessment  Angela [name] is a 18 y.o. female admitted for admitted for bradycardia and hypotensionin setting of anorexia nervosa, restricting type.She continues to be bradycardic ranging from 40s -90s with slightly improved BP. She remains orthostatic so will continue bed rest with bedside commode at this time. As her labs looks well will continue to monitor with daily Chem 10s. Will f/u macrocytic anemia with a CBC in the AM. Will reach out to nutrition today regarding food trays and feeling sick after meals. Family meeting tomorrow to discuss plan going forward.  Plan   Anorexia Nervosa, restricting type- - consultedwith Dr. Lindie Spruce, child psychology following - consult with dietician for meal plan,see  consultant notes for most currentfeedingplans.Should be meeting her goal caloric intake by today 03/24/19 according to nutritionist. - consult to social work, regarding outpatient treatment centers - consult to adolescent medicine, following along, appreciate all assistance - zyprexa 2.5mg  qhs,plan had been totitrate to 5mg  if tolerating after three days, however [name] is resistant to this increase; have discussed with Adolescent Medicine and will leave dose at 2.5 mg QHS for now - CHEM 10 daily - continuous cardiac monitor.HR improved to mostly >50, but was as low as upper 30s overnight.If HR consistently <40 while asleep(and does not increase to >40 when awoken or after consuming calories)or any other concerning rhythm changes noted on telemetry, will transfer to PICU for closer monitoring. - 1:1 sitter - daily weights  - q4h BP and vitals. Per Adolescent Medicine recommendations, will also add Gatorade 8 oz per day PRN to see if any improvement in BP. - strict I/O - qdaily orthostatics - strict bed rest and bedside commode.  - family meeting scheduled for 1230pm on Monday 4/20with patient, her mother, medical team, Adolescent medicine, Dr. Lindie Spruce, Marcelino Duster (CSW) and nutritionist [name]; will discuss progress, goals for discharge, and post-discharge plans (ie. Intensive outpatient regimen vs. Inpatient eating disorders unit)  Pancytopenia - wbc 2.4, Hgb 10.4 (MCV 108.1), plt 99. Likely from malnutrition, however B12 elevated and Folate normal. -Repeat CBC on 03/26/19 to evaluate trend of pancytopenia.If pancytopenia or macrocytic anemia is worsening rather than improving after replenishment of calories, consider consulting Hematology for further recommendations.   Abnormal Thyroid Studies - initial TFTs consistent with sick euthyroid; today TSH 0.55 and free T4 4.850 but still likely related to suppression of hypothalamic-pituitary  axis; have discussed with Adolescent  Medicine who would like to hold off on treatment for now and monitor levels for improvement as caloric intake improves  Interpreter present: no   LOS: 3 days   Angela Kenniel Bergsma, MD 03/25/2019, 2:16 PM

## 2019-03-25 NOTE — Progress Notes (Signed)
Patient reported after her evening meal that she did not get her afternoon snack.  She was to have an apple.  Will give apple at bedtime.  Patient continues to complain of nausea and stomach pain p/ meals, but does not want Zofran.  Is able to nap p/ meals to help with managing this nausea and pain.

## 2019-03-26 ENCOUNTER — Telehealth: Payer: Self-pay | Admitting: Family

## 2019-03-26 DIAGNOSIS — D7589 Other specified diseases of blood and blood-forming organs: Secondary | ICD-10-CM

## 2019-03-26 DIAGNOSIS — F5001 Anorexia nervosa, restricting type: Secondary | ICD-10-CM

## 2019-03-26 LAB — RETICULOCYTES
Immature Retic Fract: 8.6 % (ref 2.3–15.9)
RBC.: 2.96 MIL/uL — ABNORMAL LOW (ref 3.87–5.11)
Retic Count, Absolute: 109.2 10*3/uL (ref 19.0–186.0)
Retic Ct Pct: 3.7 % — ABNORMAL HIGH (ref 0.4–3.1)

## 2019-03-26 LAB — CBC WITH DIFFERENTIAL/PLATELET
Abs Immature Granulocytes: 0.01 10*3/uL (ref 0.00–0.07)
Basophils Absolute: 0 10*3/uL (ref 0.0–0.1)
Basophils Relative: 1 %
Eosinophils Absolute: 0.1 10*3/uL (ref 0.0–0.5)
Eosinophils Relative: 3 %
HCT: 32.7 % — ABNORMAL LOW (ref 36.0–46.0)
Hemoglobin: 11.1 g/dL — ABNORMAL LOW (ref 12.0–15.0)
Immature Granulocytes: 0 %
Lymphocytes Relative: 30 %
Lymphs Abs: 0.9 10*3/uL (ref 0.7–4.0)
MCH: 37.5 pg — ABNORMAL HIGH (ref 26.0–34.0)
MCHC: 33.9 g/dL (ref 30.0–36.0)
MCV: 110.5 fL — ABNORMAL HIGH (ref 80.0–100.0)
Monocytes Absolute: 0.4 10*3/uL (ref 0.1–1.0)
Monocytes Relative: 13 %
Neutro Abs: 1.6 10*3/uL — ABNORMAL LOW (ref 1.7–7.7)
Neutrophils Relative %: 53 %
Platelets: 112 10*3/uL — ABNORMAL LOW (ref 150–400)
RBC: 2.96 MIL/uL — ABNORMAL LOW (ref 3.87–5.11)
RDW: 13.2 % (ref 11.5–15.5)
WBC: 3 10*3/uL — ABNORMAL LOW (ref 4.0–10.5)
nRBC: 0 % (ref 0.0–0.2)

## 2019-03-26 LAB — BASIC METABOLIC PANEL
Anion gap: 8 (ref 5–15)
BUN: 18 mg/dL (ref 6–20)
CO2: 28 mmol/L (ref 22–32)
Calcium: 9.2 mg/dL (ref 8.9–10.3)
Chloride: 104 mmol/L (ref 98–111)
Creatinine, Ser: 0.77 mg/dL (ref 0.44–1.00)
GFR calc Af Amer: >60 mL/min (ref >60)
GFR calc non Af Amer: >60 mL/min (ref >60)
Glucose, Bld: 83 mg/dL (ref 70–99)
Potassium: 4.1 mmol/L (ref 3.5–5.1)
Sodium: 140 mmol/L (ref 135–145)

## 2019-03-26 LAB — MAGNESIUM: Magnesium: 2.2 mg/dL (ref 1.7–2.4)

## 2019-03-26 LAB — PHOSPHORUS: Phosphorus: 3.8 mg/dL (ref 2.5–4.6)

## 2019-03-26 LAB — T3, REVERSE: T3, Reverse: 13.8 ng/dL (ref 9.2–24.1)

## 2019-03-26 MED ORDER — OLANZAPINE 2.5 MG PO TABS
2.5000 mg | ORAL_TABLET | Freq: Every day | ORAL | Status: DC
  Administered 2019-03-26 – 2019-03-30 (×5): 2.5 mg via ORAL
  Filled 2019-03-26 (×7): qty 1

## 2019-03-26 NOTE — Progress Notes (Signed)
Pediatric Teaching Program  Progress Note   Subjective  Patient seen this morning sitting upright in bed, she was asking for a snack and receiving orange.  She is in great spirits this morning.  I told her our team spoke with the nutritionist about the discrepancies and food the patient is receiving versus what was ordered.  Angela [name] reported we (nurses, doctors) are allowed to modify the patient's tray to match what is in her detailed note.  (This comes from the patient recently receiving 3 sweet potatoes for dinner, although the note from the nutritionist stated the patient is to only received 2 sweet potatoes.)  The patient is understanding of and appreciative of this information and has no other questions at this time.  Objective  Temp:  [97.5 F (36.4 C)-98.1 F (36.7 C)] 97.5 F (36.4 C) (04/20 0800) Pulse Rate:  [40-95] 72 (04/20 0800) Resp:  [12-26] 15 (04/20 0800) BP: (85-98)/(55-65) 98/56 (04/20 0800) SpO2:  [93 %-100 %] 100 % (04/20 0800) Weight:  [40.3 kg] 40.3 kg (04/20 0417) General: No apparent distress, alert, pleasant female HEENT: EOMI, PERRLA, patent nares, MMM CV: RRR, S1-S2 present, no murmurs, rubs, gallops; heart rate 86 at this time Pulm: CTA bilaterally, no respiratory distress Abd: Soft, nontender, no masses appreciated, bowel sounds in 4 quadrants Skin: No rashes or ecchymoses Ext: No deformity or injury, moving all extremities spontaneously  Labs and studies were reviewed and were significant for: Weight: 39.9kg > 40.5kg > 40.3kg (4/20) Phosphorus: 3.8 Mg: 2.2 Retic count: 3.7% CBC    Component Value Date/Time   WBC 3.0 (L) 03/26/2019 0613   RBC 2.96 (L) 03/26/2019 0613   RBC 2.96 (L) 03/26/2019 0613   HGB 11.1 (L) 03/26/2019 0613   HCT 32.7 (L) 03/26/2019 0613   PLT 112 (L) 03/26/2019 0613   MCV 110.5 (H) 03/26/2019 0613   MCH 37.5 (H) 03/26/2019 0613   MCHC 33.9 03/26/2019 0613   RDW 13.2 03/26/2019 0613   LYMPHSABS 0.9 03/26/2019 0613   MONOABS 0.4 03/26/2019 0613   EOSABS 0.1 03/26/2019 0613   BASOSABS 0.0 03/26/2019 0613   BMP Latest Ref Rng & Units 03/26/2019 03/25/2019 03/24/2019  Glucose 70 - 99 mg/dL 83 77 360(O)  BUN 6 - 20 mg/dL 18 18 77(C)  Creatinine 0.44 - 1.00 mg/dL 3.40 3.52 4.81  Sodium 135 - 145 mmol/L 140 137 139  Potassium 3.5 - 5.1 mmol/L 4.1 3.9 4.0  Chloride 98 - 111 mmol/L 104 104 102  CO2 22 - 32 mmol/L 28 28 25   Calcium 8.9 - 10.3 mg/dL 9.2 9.2 9.4   Assessment  Angela [name] is a 18 y.o. female admitted for bradycardia and hypotensionin setting of anorexia nervosa, restricting type.She continues to be bradycardic ranging from 40s -90s with slightly improved BP. Remains orthostatic, will continue bed rest w/ bedside commode at this time. Labs look well, will monitor with daily BMPs (instead of BID). Will f/u macrocytic anemia with a CBC in the AM. Will reach out to nutrition today regarding food trays and feeling sick after meals. Family meeting today 4/20 to discuss plan going forward.  Plan  Anorexia Nervosa, restricting type: - consultedwith Dr. Lindie [name], child psychology following - consult with dietician for meal plan,see consultant notes for most currentfeedingplans. - consult to social work, regarding outpatient treatment centers - consult to adolescent medicine, following along, appreciate all assistance - mtg at 12:30 on 4/21 - zyprexa 2.5mg  qhs, may increase to 5mg  - Daily BMP - continuous cardiac  monitor.HR improving, 41-95 ON. - 1:1 sitter - daily weights  - q4h BP and vitals.Per Adolescent Medicine recommendations, will also add Gatorade 8 oz per day PRN to see if any improvement in BP. - strict I/O - qdaily orthostatics - strict bed rest and bedside commode.  - family meeting scheduled for 1230pm on Monday4/20with patient, her mother, medical team, Adolescent medicine, Dr. Lindie SpruceWyatt, Angela DusterMichelle (CSW) and nutritionist Angela [name]; will discuss progress, goals for discharge, and  post-discharge plans (ie. Intensive outpatient regimen vs. Inpatient eating disorders unit)  Pancytopenia, improving: wbc 2.4>3.0, Hgb 10.4>11.1 (MCV 108.1), plt 99>112. Likely from malnutrition. 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

## 2019-03-26 NOTE — Progress Notes (Signed)
LATE ENTRY: Visited pt yesterday 03/25/2019. Offered pt activities she could do in bed. Pt expressed that she was bored. Suggested some craft activities to pt, she chose to paint after her lunch. Took pt supplies and pt promptly began painting. Pt painted a picture frame, a canvas and a greeting card. Pt was appreciative. Will continue to offer activities to pt for distraction while she is here.

## 2019-03-26 NOTE — Progress Notes (Signed)
Per Foster Simpson said to check food orders before giving meals go by her note. Nutrition has been giving too much food. Go exactly by food given. Even if she wants the extra food.  Pt. Wants shower but BP ortho still not good, nor heart rate, must also continue to use bedside commode, but requested it to be moved before eating . Wants it out of sight. Also pt.said sometimes did not want to eat food because food cold.Notified that staff could warm.

## 2019-03-26 NOTE — Progress Notes (Signed)
End of shift note:  Patient was calm and cooperative through out shift. She did not gain weight, she lost.  Spent time talking with patient and she expressed how she genuinely wants to get better. She expressed many reasons as to why she does want to get better. One reason is because of a job in [place] she has. 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

## 2019-03-26 NOTE — Progress Notes (Signed)
FOLLOW UP PEDIATRIC/NEONATAL NUTRITION ASSESSMENT Date: 03/26/2019   Time: 12:16 PM   RD working remotely.  Reason for Assessment: Eating disorder, Consult for assessment of nutrition requirements/status  ASSESSMENT: Female 18 y.o.   Admission Dx/Hx:  18 y.o. female who presents with bradycardia and hypotension with a history of anorexia nervosa.  Weight: 40.3 kg(0.16%) Length/Ht: 5\' 2"  (157.5 cm) (19%) Body mass index is 16.25 kg/m. Plotted on CDC growth chart  Assessment of Growth: Pt meets criteria for MODERATE MALNUTRITION as evidenced by BMI for age z-score of -2.05.  Estimated Needs:  47 ml/kg 50-55 Kcal/kg 2000-2200 calories/day 1.5-2 g Protein/kg   RD contacted pt via inpatient room phone. Meal completion has been 100%. Pt with a 200 gram weight loss from yesterday, however with an overall weight gain of 600 grams over the weekend.   Over the weekend, kitchen has brought up multiple food items that did not match up with foods RD has ordered at meals, such as extra sweet potatoes and/or soy milk containers. Meal completion continued to be 100%, however pt reports nausea and abdominal pains from too large of volumes at meals. RD to contact kitchen regarding need for meal food tray accuracy. Nursing staff and MD team have been notified that meal trays may be modified to match RD ordered meals according to food list if kitchen tray differs.   RD discussed with patient regarding caloric dense foods to aid in decreasing food volumes at meals. Pt requested snack between meals to aid with food meal volumes instead. Meal plan has been revised to include snacks between meals. Pt currently is meeting full nutrition needs.  RD to continue to monitor.   Urine Output: 2.3 mL/kg/hr  Related Meds: MVI, Zofran, Ensure  Labs reviewed. Potassium, magnesium, phosphorous WNL.  IVF:    NUTRITION DIAGNOSIS: -Malnutrition (NI-5.2) related to disordered eating as evidenced by BMI for age  z-score of -2.05. Status: Ongoing  MONITORING/EVALUATION(Goals): PO intake Weight trends; goal of at least 100-200 gram gain/day Labs I/O's  INTERVENTION:   Ensure Enlive po PRN if meal completion inadequate, each supplement provides 350 kcal and 20 grams of protein.   Continue multivitamin once daily.   May allow bottle of mustard in patient's room.    May allow up to 9 cups of fluids daily. May allow 1 cup of ice chips TID. (MD may adjust as needed).  Patient is currently meeting full nutrition goals.   Roslyn Smiling, MS, RD, LDN Pager # [phone] After hours/ weekend pager # [phone]

## 2019-03-26 NOTE — Progress Notes (Signed)
CSW participated in team family meeting today along with various members of the medical team. Medical update provided as well as recommendations given by adolescent medicine. At this time, inpatient treatment is recommended as next step for patient. CSW tasked with gathering information regarding possible facilities and then will follow up with patient.  CSW called to [facility], [address] and [place]. [facility] and [facility] are accepting referrals and admitting to residential and inpatient care while all of their other services are now provided virtually. CSW left message for [facility]. Will follow up.   Gerrie Nordmann, LCSW [phone]

## 2019-03-26 NOTE — Progress Notes (Addendum)
Nutrition Brief Note  RD working remotely.  Nursing and care team may modify food meal trays to match list below if tray brought up from kitchen differs.  List of food items RD has ordered at meals:  Monday 4/20:  Mid-morning snack to arrive at around ~11:00-11:15 am: 1 orange  Lunch to arrive at noon: 8 ounces soy milk 1 fruit cup 1 banana 2 servings of salad 2 packets of italian dressing 1 sweet potato 6 slices of [place] deli meat with no bread 6 packets of mustard 10 oz bottle water  Snack to arrive at 2pm: 1 apple  Dinner to arrive at 5pm: 8 ounces soy milk 1 serving of grapes 1 banana 2 servings salad 2 packets of italian dressing 9 slices of deli ham with no bread 6 packets mustard 10 oz bottle water  Tuesday 4/21: Breakfast to arrive at 7:30am: 8 ounces soy milk 1 apple 2 servings of cheerios 4 boiled eggs 1 cup decaf coffee 10 oz bottle water  Roslyn Smiling, MS, RD, LDN Pager # [phone] After hours/ weekend pager # [phone]

## 2019-03-26 NOTE — Progress Notes (Addendum)
Madylan appeared in a good mood, smiling as she greeted me. As she is still bradycardic and has positive orthostatics she is still on bed rest with bedside commode use. Saanjh reviewed all the dietary issues that occurred over the weekend. She felt the volume of food was too much but forced herself to eat it as she dislikes the Ensure. This led her to feeling nauseous and feeling like she might throw-up, but she didn't. All her dietary issues were deferred to discussion with [name] the dietitian who did call in as my time with Hee was over.  Jaydalee did not increase her Zyprexa because she is worried that it may be making her feel "tired and groggy" in the morning. Sometimes she feels "off." Medication will be discussed at the 12:30 meeting today.  Nichele's affect was bright. She was reasonable in her concerns and in the way she discussed them with me. Plan for meeting today with Delfina, her mother and the team.   Time: 20 minutes [name]

## 2019-03-26 NOTE — Progress Notes (Addendum)
Summary note from Patient/family/team meeting: In attendance: Maliya Adaria's mother Peggyann Shoals, MD Cameron Ali, MD Colvin Caroli, PhD Gerrie Nordmann, CSW Via phone:  [name], NP [name].  This team reviewed what brought Ariany into the hospital and how she is currently doing. She now meets her full nutritional goals and continues to order through the dietitian. After discussion it was agreed that Nicolemarie would stay on her current dose of Zyprexa but take closer to dinner time. She is showing some improvement in orthostatics, heart rate, and blood pressure and will be allowed to walk to the bathroom instead of using the bedside commode. Recommendation for an inpatient eating disorder program was made. Oona asked about trying outpatient treatment by seeing a therapist, nutritionist and [name] provider.She did give her permission for our social worker to begin to look into programs to see what is available in the era of Covid restrictions. Rayfield Citizen received  Emotional/psychosocial support during the meeting as she became tearful at times.   Later Rayfield Citizen asked to speak directly to me to address her feelings and concerns about an inpatient residential eating disorder treatment referral.   Total time 65 minutes

## 2019-03-27 ENCOUNTER — Ambulatory Visit: Payer: 59 | Admitting: Licensed Clinical Social Worker

## 2019-03-27 LAB — BASIC METABOLIC PANEL
Anion gap: 10 (ref 5–15)
BUN: 19 mg/dL (ref 6–20)
CO2: 27 mmol/L (ref 22–32)
Calcium: 9.3 mg/dL (ref 8.9–10.3)
Chloride: 103 mmol/L (ref 98–111)
Creatinine, Ser: 0.58 mg/dL (ref 0.44–1.00)
GFR calc Af Amer: >60 mL/min (ref >60)
GFR calc non Af Amer: >60 mL/min (ref >60)
Glucose, Bld: 76 mg/dL (ref 70–99)
Potassium: 3.8 mmol/L (ref 3.5–5.1)
Sodium: 140 mmol/L (ref 135–145)

## 2019-03-27 LAB — PHOSPHORUS: Phosphorus: 3.4 mg/dL (ref 2.5–4.6)

## 2019-03-27 LAB — MAGNESIUM: Magnesium: 2.2 mg/dL (ref 1.7–2.4)

## 2019-03-27 NOTE — Progress Notes (Signed)
Angela [name] was again smiling as she greeted me and appeared eager to talk. She stated that it "feels really good" to be able to stand and walk to the bathroom. She noted no symptoms. We also talked about her having a daily seated 5 minute shower which made her quite happy. Regarding the recommendations from her team about a residentail treatment for eating disorders she told me that basically she was "not really open" to this but that she did "look into programs" by going on line. She expressed more interest in the [facility] eat dis center and the [facility] program in [place] than other programs. [name] and I again spent considerable time talking about her wishes for her future and potential ways she might get there. She still prefers the outpatient route and is now challenging herself to determine how she can increase the likelihood of being compliant with an outpatient program. She feels that scheduling her next appointments at the end of her current appointment rather than saying she will call in will be helpful. She would like to talk with [name], NP at [facility] Medicine clinic sometime tomorrow. I have spoken with our social worker who will be in contact with [name]. I will continue to see [name] on a daily basis.  Time 20 minutes

## 2019-03-27 NOTE — Progress Notes (Signed)
She was eating all for breakfast and lunch. She didn't finish [place] due to the quality of the meat. She understood and took Ensure. She is voiding well. She took a seated 5 minutes shower. She ambulated in hallways with PT. She was tolerated well.

## 2019-03-27 NOTE — Progress Notes (Signed)
Nutrition Brief Note  RD working remotely.  Nursing and care team may modify food meal trays to match list below if tray brought up from kitchen differs.  List of food items RD has ordered at meals:  Tuesday 4/21:  Mid-morning snack at 10 am: 1 orange  Lunch to arrive at noon: 8 ounces soy milk 1 fruit cup 2 servings of salad 2 packets of italian dressing 2 servings of rice krispies cereal 9 slices of [place] deli meat with no bread 6 packets of mustard 10 oz bottle water  Snack to arrive at 2pm: 1 apple  Dinner to arrive at 5pm: 8 ounces soy milk 2 fruit cups 1 banana 2 servings salad 2 packets of italian dressing Roasted [place] breast 6 packets mustard 10 oz bottle water  Wednesday 4/22: Breakfast to arrive at 7:30am: 8 ounces soy milk 1 apple 1 cup of grapes 2 servings of cheerios 4 boiled eggs 1 cup decaf coffee 10 oz bottle water  Roslyn Smiling, MS, RD, LDN Pager # [phone] After hours/ weekend pager # [phone]

## 2019-03-27 NOTE — Progress Notes (Signed)
CSW spoke with patient this morning to discuss referrals. Patient states she was able to research facilities from list provided yesterday and would be most interested in program at [facility] or [place] in [place]. CSW expressed that all facilities are accepting referrals and admissions to inpatient and residential care, but all other services currently offered virtually. Patient stated she still would like to follow an outpatient plan, but is considering inpatient care. CSW again stated that application to program is not a commitment, still patient declines sending any applications at this time. CSW also reached out top [name], Adolescent Medicine NP to provide update. Ms. Yetta Barre will meet with patient virtually tomorrow at 1pm. CSW will continue to follow, assist as needed.   Gerrie Nordmann, LCSW [phone]

## 2019-03-27 NOTE — Evaluation (Signed)
Physical Therapy Evaluation Patient Details Name: Angela RollsCaroline M [name] MRN: [id number] DOB: 2001/08/22 Today's Date: 03/27/2019   History of Present Illness  Angela [name] is a 18 y.o. female who presents with bradycardia and hypotension with a history of disordered eating.  Clinical Impression  Pt admitted with above. Pt functioning at baseline. Pt with no report of dizziness or lightheadedness t/o session. Pts Bp was 96/68 sitting, Standing 89/78, and s/p walking 94/66. Pts HR <100 t/o session. From mobility stand point pt safe to ambulate in hallways with RN staff and take a standing shower if Medical team in agreeance. Discussed exercising with patient, telling pt she needs to discuss with MD as they don't want her burning too many calories. Acute PT to cont to follow if MD only wants PT to ambulate with patient.    Follow Up Recommendations No PT follow up    Equipment Recommendations  None recommended by PT    Recommendations for Other Services       Precautions / Restrictions Precautions Precautions: Other (comment) Precaution Comments: watch heart rate and BP Restrictions Weight Bearing Restrictions: No      Mobility  Bed Mobility Overal bed mobility: Independent             General bed mobility comments: denies dizziness or lightheadness  Transfers Overall transfer level: Independent Equipment used: None             General transfer comment: denies dizziness or lightheadness  Ambulation/Gait Ambulation/Gait assistance: Independent Gait Distance (Feet): 300 Feet Assistive device: None Gait Pattern/deviations: WFL(Within Functional Limits) Gait velocity: wfl Gait velocity interpretation: >2.62 ft/sec, indicative of community ambulatory General Gait Details: no episode of LOB, dizziness, lightheadedness  Stairs            Wheelchair Mobility    Modified Rankin (Stroke Patients Only)       Balance Overall balance assessment: Independent                               Standardized Balance Assessment Standardized Balance Assessment : Dynamic Gait Index   Dynamic Gait Index Level Surface: Normal Change in Gait Speed: Normal Gait with Horizontal Head Turns: Normal Gait with Vertical Head Turns: Normal Gait and Pivot Turn: Normal Step Over Obstacle: Normal Step Around Obstacles: Normal Steps: Normal Total Score: 24       Pertinent Vitals/Pain Pain Assessment: No/denies pain    Home Living Family/patient expects to be discharged to:: Private residence Living Arrangements: Parent(mother and younger sister) Available Help at Discharge: Family;Available PRN/intermittently Type of Home: House Home Access: Level entry     Home Layout: One level Home Equipment: None      Prior Function Level of Independence: Independent         Comments: in/out of school, was workign as a Pensions consultantnanny     Hand Dominance   Dominant Hand: Right    Extremity/Trunk Assessment   Upper Extremity Assessment Upper Extremity Assessment: Generalized weakness    Lower Extremity Assessment Lower Extremity Assessment: Generalized weakness    Cervical / Trunk Assessment Cervical / Trunk Assessment: Normal  Communication   Communication: No difficulties  Cognition Arousal/Alertness: Awake/alert Behavior During Therapy: WFL for tasks assessed/performed Overall Cognitive Status: Within Functional Limits for tasks assessed  General Comments General comments (skin integrity, edema, etc.): wfl    Exercises     Assessment/Plan    PT Assessment Patient needs continued PT services  PT Problem List Decreased strength       PT Treatment Interventions DME instruction;Gait training;Stair training;Functional mobility training;Therapeutic activities;Therapeutic exercise;Balance training    PT Goals (Current goals can be found in the Care Plan section)  Acute Rehab PT  Goals Patient Stated Goal: home PT Goal Formulation: With patient Time For Goal Achievement: 04/10/19 Potential to Achieve Goals: Good Additional Goals Additional Goal #1: Pt to score >42 on Berg to indicate minimal falls risk.    Frequency Min 2X/week   Barriers to discharge        Co-evaluation               AM-PAC PT "6 Clicks" Mobility  Outcome Measure Help needed turning from your back to your side while in a flat bed without using bedrails?: None Help needed moving from lying on your back to sitting on the side of a flat bed without using bedrails?: None Help needed moving to and from a bed to a chair (including a wheelchair)?: None Help needed standing up from a chair using your arms (e.g., wheelchair or bedside chair)?: None Help needed to walk in hospital room?: None Help needed climbing 3-5 steps with a railing? : None 6 Click Score: 24    End of Session   Activity Tolerance: Patient tolerated treatment well Patient left: in bed;with call bell/phone within reach;with nursing/sitter in room Nurse Communication: Mobility status PT Visit Diagnosis: Dizziness and giddiness (R42)    Time: 1916-6060 PT Time Calculation (min) (ACUTE ONLY): 20 min   Charges:   PT Evaluation $PT Eval Low Complexity: 1 Low          Lewis Shock, PT, DPT Acute Rehabilitation Services Pager #: [phone] Office #: [phone]   Iona Hansen 03/27/2019, 12:53 PM

## 2019-03-27 NOTE — Progress Notes (Signed)
FOLLOW UP PEDIATRIC/NEONATAL NUTRITION ASSESSMENT Date: 03/27/2019   Time: 11:47 AM   RD working remotely.  Reason for Assessment: Eating disorder, Consult for assessment of nutrition requirements/status  ASSESSMENT: Female 18 y.o.   Admission Dx/Hx:  19 y.o. female who presents with bradycardia and hypotension with a history of anorexia nervosa.  Weight: 40.2 kg(0.15%) Length/Ht: 5\' 2"  (157.5 cm) (19%) Body mass index is 16.21 kg/m. Plotted on CDC growth chart  Assessment of Growth: Pt meets criteria for MODERATE MALNUTRITION as evidenced by BMI for age z-score of -2.05.  Estimated Needs:  47 ml/kg 50-55 Kcal/kg 2000-2200 calories/day 1.5-2 g Protein/kg   RD contacted pt via inpatient room phone. Meal completion has been 100%. Appetite has been good. Pt reports no abdominal discomforts. Pt with a 100 gram weight loss from yesterday. Patient's activity has been increased. Meal plan has been adjusted to account for increased activity as well as in weight gain. Pt currently is meeting full nutrition needs.  RD to continue to monitor.   Urine Output: 5.1 mL/kg/hr  Related Meds: MVI, Zofran, Ensure  Labs reviewed. Potassium, magnesium, phosphorous WNL.  IVF:    NUTRITION DIAGNOSIS: -Malnutrition (NI-5.2) related to disordered eating as evidenced by BMI for age z-score of -2.05. Status: Ongoing  MONITORING/EVALUATION(Goals): PO intake Weight trends; goal of at least 100-200 gram gain/day Labs I/O's  INTERVENTION:   Ensure Enlive po PRN if meal completion inadequate, each supplement provides 350 kcal and 20 grams of protein.   Continue multivitamin once daily.   May allow bottle of mustard in patient's room.    May allow up to 9 cups of fluids daily. May allow 1 cup of ice chips TID. (MD may adjust as needed).  Patient is currently meeting full nutrition goals.   Roslyn Smiling, MS, RD, LDN Pager # [phone] After hours/ weekend pager # [phone]

## 2019-03-27 NOTE — Progress Notes (Signed)
Pediatric Teaching Program  Progress Note   Subjective  Patient seen sitting upright in bed, she is excited for her shower today.  She was notified that physical therapy was asked to come by to see the ER and maybe walk with her a little bit today. She reports she is still eating all of the food given to her and her snacks. She denies headaches, lightheadedness, fever, cough, chills, nausea, vomiting, abdominal pain, and stool changes.  The patient was seen again after rounding.  She had already had her shower was very grateful.  Physical therapy had also come by to walk the patient in the hallway and stated she did very well and that her blood pressures remained stable with ambulation.  They did not make specific note of the heart rate but did report she was not tachycardic during ambulation.  Objective  Temp:  [97.2 F (36.2 C)-98.5 F (36.9 C)] 97.2 F (36.2 C) (04/21 0348) Pulse Rate:  [43-72] 48 (04/21 0400) Resp:  [13-18] 15 (04/21 0400) BP: (87-99)/(54-64) 91/54 (04/21 0348) SpO2:  [98 %-100 %] 98 % (04/20 2000) Weight:  [40.2 kg] 40.2 kg (04/21 0500)   General: No apparent distress, thin/frail appearing, pleasant female CV: RRR, S1-S2 present, no murmurs, rubs, gallops; heart rate 79 Pulm: CTA bilaterally, no respiratory distress Abd: Soft, nontender, bowel sounds auscultated Ext: Spontaneous movement in all extremities, no deformity or injury  Labs and studies were reviewed and were significant for: BMP Latest Ref Rng & Units 03/27/2019 03/26/2019 03/25/2019  Glucose 70 - 99 mg/dL 76 83 77  BUN 6 - 20 mg/dL 19 18 18   Creatinine 0.44 - 1.00 mg/dL 9.45 0.38 8.82  Sodium 135 - 145 mmol/L 140 140 137  Potassium 3.5 - 5.1 mmol/L 3.8 4.1 3.9  Chloride 98 - 111 mmol/L 103 104 104  CO2 22 - 32 mmol/L 27 28 28   Calcium 8.9 - 10.3 mg/dL 9.3 9.2 9.2  Phos: 3.4 Mg: 2.2  Assessment  Angela [name] is a 18 y.o. female admitted for bradycardia and symptomatic hypotension secondary  to severe anorexia.  Her vitals continue to improve at baseline but the patient continues to have orthostatic hypotension.  She continues to follow dietary instruction per nutrition.  Patient had in-depth discussion with adolescent medicine, social work, child psychology, and her mother to discuss options for future placement.  Patient remains undecided but is leaning more towards outpatient medicine at this time.  Plan  Anorexia Nervosa, restricting type, stable: -Child psychology following - appreciate recommendations -Nutrition consulted for meal planning - appreciate recommendations.  Nutrition was updated 4/21 regarding patient's increased activity and has adjusted meal plan accordingly. -Social work has provided information regarding treatment options and facilities. -PT consulted - appreciate recommendations -Continue Zyprexa 2.5 mg with dinner -BMP every other day -Continuous cardiac monitoring -1:1 sitter -daily weights  -q4h BP and vitals. -qdaily orthostatics -Per Dr. Leotis Shames, patient may stand bedside every 2 hours to stretch her legs and may shower standing up daily for less than 5 minutes.  Abnormal Thyroid Studies: rT3 has returned within normal range.  We will follow-up with thyroid studies and assess need for treatment at a later date.   -Endocrinology following -No treatment at this time   Interpreter present: no   LOS: 5 days   Dollene Cleveland, DO 03/27/2019, 7:21 AM

## 2019-03-28 ENCOUNTER — Ambulatory Visit: Payer: 59 | Admitting: Family

## 2019-03-28 MED ORDER — AQUAPHOR EX OINT
TOPICAL_OINTMENT | Freq: Two times a day (BID) | CUTANEOUS | Status: DC | PRN
  Administered 2019-03-28: 15:00:00 via TOPICAL
  Filled 2019-03-28: qty 50

## 2019-03-28 NOTE — Progress Notes (Addendum)
She is calm and cooperative, She is eating well. She is tolerating walking with PT. RN noticed her hands were very dry and fraky. Notified MD Dareen Piano and given Aquaphor as ordered.   She was on the face time with Adolescent NP. She was crying while she was talking to mom on the phone. RN spoke to [place] after the conversation with mom. She was upset because mom making up decision to send her to inpatient facility without her agreement. She said mom was right but she wanted to involve decision making. She wanted to Dr. Lindie Spruce call mom. RN contacted the MD and had her to call her after her lunch. She took bit of time to calm down and ready to eat lunch. She finished all her lunch.   This afternoon she seemed better emotionally after talking to the MD.

## 2019-03-28 NOTE — Progress Notes (Addendum)
Nutrition Brief Note  Nursing and care team may modify food meal trays to match list below if tray brought up from kitchen differs.  List of food items RD has ordered at meals:  Wednesday 4/22:  Mid-morning snack at 10 am: 1 orange  Lunch to arrive at noon: 8 ounces soy milk 2 fruit cups 2 servings of salad 2 packets of italian dressing 2 servings of rice krispies cereal 2 servings of scrambled eggs 1 cup decaf coffee 10 oz bottle water  Snack to arrive at 2pm: 1 apple  Dinner to arrive at 5pm: 8 ounces soy milk 2 fruit cups 1 banana 2 servings salad 1 packet of Balsamic vinegrette dressing 9 slices deli [place] meat with no bread 6 packets mustard 1 cup decaf coffee 10 oz bottle water  Thursday 4/23: Breakfast to arrive at 7:30am: 8 ounces soy milk 1 apple 1 cup of grapes 2 servings of cheerios 4 boiled eggs 1 cup decaf coffee 10 oz bottle water  Roslyn Smiling, MS, RD, LDN Pager # [phone] After hours/ weekend pager # [phone]

## 2019-03-28 NOTE — Progress Notes (Signed)
HR overnight ranged from low 40s-50s. Patient has been afebrile. She was cooperative and pleasant on initial assessment. Angela [name] slept most of the night. Sitter at bedside throughout the night. She gained weight this morning, going from 40.2 to 40.5kg.

## 2019-03-28 NOTE — Progress Notes (Addendum)
Angela [name] reported that she is doing well, maybe a little bored but feeling okay. She is aware that her blood pressures and heart rate are improving and she experiences no symptoms when she walks to the bathroom or walks with the PT. She really enjoyed walking in the halls yesterday. She has been approved for a 5 minute standing shower. Angela [name]'s mood remains bright. She is engaging, likes to joke and tease, and really seems to enjoy talking with her team. The current plan is for her to Facetime with Angela List, NP from [name] today at 11:00 am (change of time from 1:00 pm) . I encouraged Angela [name] to take ownership of her choices, to formulate a plan for herself, and to have a back-up plan just in case. She appears to want to try an out-patient routine with [name], nutrition and Therapist. She says she will continue the Zyprexa. Today she also stated that "if" she were to fail outpatient care, for whatever reason, she "will do inpatient."  Dietitian was paged as Angela [name] has specific questions regarding at-home eating plan.   Time : 20 minutes

## 2019-03-28 NOTE — Progress Notes (Addendum)
Pediatric Teaching Program  Progress Note   Subjective  Patient seen this morning resting in bed.  Orthostatic vitals continue to show hypotension with 40 bpm jump in heart rate.  Patient denies any lightheadedness or fatigue with testing orthostatic vitals.  She denies nausea, vomiting, abdominal pain.  She reports that she is eating all of her food with every meal; however, last night she received a piece of [place] with "veins and weird stuff" and it so she did not finish it, but supplemented with Ensure.  At this point, the patient is still leaning towards continuing her treatment outpatient.  She expressed to me this morning that her last experience inpatient was callus and felt "the people did not care about me or want to teach me how to be healthy".  The patient feels in order to best prepare herself for outpatient treatment that she needs to have a better understanding of how to calculate macronutrients and different foods and exactly what to eat, when, and why.  She is open to being educated about this.  Objective  Temp:  [97.6 F (36.4 C)-98 F (36.7 C)] 97.6 F (36.4 C) (04/21 2343) Pulse Rate:  [43-76] 43 (04/22 0400) Resp:  [13-22] 13 (04/22 0400) BP: (83-100)/(52-66) 90/52 (04/22 0009) SpO2:  [98 %-100 %] 98 % (04/22 0400) Weight:  [40.5 kg] 40.5 kg (04/22 0436) General: No apparent distress, thin/frail appearing, pleasant female CV: RRR, S1S2 present, no murmurs, rubs or gallops; current HR 89 Pulm: CTA bilaterally, normal work of breathing  Abd: soft, nontender to palpation, no masses, positive bowel sounds Ext: thin extremities, well-perfused, no injury or deformity   Labs and studies were reviewed and were significant for: No new labs. Will recheck BMP on 4/23 for every-other-day labs.  Assessment  Angela [name] is a 18 y.o. female admitted for bradycardia and symptomatic hypotension secondary to severe anorexia.  Her vitals continue to improve at baseline but the  patient continues to have orthostatic hypotension.  She continues to follow dietary instruction per nutrition.  Patient had in-depth discussion with adolescent medicine, social work, child psychology, and her mother to discuss options for future placement.  Patient remains undecided but is leaning more towards outpatient medicine at this time.  Plan  Anorexia Nervosa, restricting type, stable: -Child psychology following - appreciate recommendations -Nutrition consulted for meal planning - appreciate recommendations.  Nutrition was updated 4/21 regarding patient's increased activity and has adjusted meal plan accordingly. -Social work has provided information regarding treatment options and facilities. -PT consulted - appreciate recommendations -Continue Zyprexa 2.5 mg with dinner -BMP every other day -Continuous cardiac monitoring -1:1 sitter -daily weights - 40.2kg > 40.5kg (gained 300g 4/22) -q4h BP and vitals. -qdaily orthostatics -Patient may stand bedside every 2 hours to stretch her legs and may shower standing up daily for less than 5 minutes.  Dry skin: seen on knuckles of bilateral hands. -Aquaphor to affected areas BID PRN  Abnormal Thyroid Studies:rT3 has returned within normal range.  We will follow-up with thyroid studies and assess need for treatment at a later date.   -Endocrinology following - also recommend no treatment at this time. -Repeat thyroid studies around May 1st   Interpreter present: no   LOS: 6 days   Dollene Cleveland, DO 03/28/2019, 7:31 AM

## 2019-03-28 NOTE — Progress Notes (Signed)
FOLLOW UP PEDIATRIC/NEONATAL NUTRITION ASSESSMENT Date: 03/28/2019   Time: 11:33 AM   Reason for Assessment: Eating disorder, Consult for assessment of nutrition requirements/status  ASSESSMENT: Female 18 y.o.   Admission Dx/Hx:  18 y.o. female who presents with bradycardia and hypotension with a history of anorexia nervosa.  Weight: 40.5 kg(0.20%) Length/Ht: 5\' 2"  (157.5 cm) (19%) Body mass index is 16.33 kg/m. Plotted on CDC growth chart  Assessment of Growth: Pt meets criteria for MODERATE MALNUTRITION as evidenced by BMI for age z-score of -2.05.  Estimated Needs:  47 ml/kg 50-55 Kcal/kg 2000-2200 calories/day 1.5-2 g Protein/kg   Meal completion has been 80-100%. Meals not completed were supplemented with Ensure accordingly. Appetite has been good. Pt reports no abdominal discomforts. Pt with a 300 gram weight gain from yesterday. Patient's activity has been increased. Meal plan has been adjusted to account for increased activity as well as in weight gain. Pt currently is meeting full nutrition needs. Pt requesting education regarding meal plan for home. RD to provide and educate.   RD to continue to monitor.   Urine Output: 4.3 mL/kg/hr  Related Meds: MVI, Zofran, Ensure  Labs reviewed.  IVF:    NUTRITION DIAGNOSIS: -Malnutrition (NI-5.2) related to disordered eating as evidenced by BMI for age z-score of -2.05. Status: Ongoing  MONITORING/EVALUATION(Goals): PO intake Weight trends; goal of at least 100-200 gram gain/day Labs I/O's  INTERVENTION:   Ensure Enlive po PRN if meal completion inadequate, each supplement provides 350 kcal and 20 grams of protein.   Continue multivitamin once daily.   May allow bottle of mustard in patient's room.    May allow up to 9 cups of fluids daily. May allow 1 cup of ice chips TID. (MD may adjust as needed).  Patient is currently meeting full nutrition goals. RD to provide and discuss with patient regarding meal plan  for home.   Roslyn Smiling, MS, RD, LDN Pager # [phone] After hours/ weekend pager # [phone]

## 2019-03-28 NOTE — Progress Notes (Signed)
Physical Therapy Treatment Patient Details Name: Angela [name] MRN: [id number] DOB: 05/11/2001 Today's Date: 03/28/2019    History of Present Illness Angela [name] is a 18 y.o. female who presents with bradycardia and hypotension with a history of disordered eating.    PT Comments    Pt continuing to make steady progress with functional mobility. No dizziness, lightheadedness or LOB throughout. Pt's HR fluctuating at 61-93 bpm. BP in standing after ambulation was 94/66 mmHg and in supine at end of session was 89/71 mmHg. Discussed pt's progress with medical team. No further acute PT needs identified at this time. PT signing off.    Follow Up Recommendations  No PT follow up     Equipment Recommendations  None recommended by PT    Recommendations for Other Services       Precautions / Restrictions Precautions Precautions: Other (comment) Precaution Comments: watch heart rate and BP Restrictions Weight Bearing Restrictions: No    Mobility  Bed Mobility Overal bed mobility: Independent                Transfers Overall transfer level: Independent                  Ambulation/Gait Ambulation/Gait assistance: Independent Gait Distance (Feet): 500 Feet Assistive device: None Gait Pattern/deviations: WFL(Within Functional Limits) Gait velocity: wfl   General Gait Details: no episode of LOB, dizziness, lightheadedness   Stairs             Wheelchair Mobility    Modified Rankin (Stroke Patients Only)       Balance                                            Cognition Arousal/Alertness: Awake/alert Behavior During Therapy: WFL for tasks assessed/performed Overall Cognitive Status: Within Functional Limits for tasks assessed                                        Exercises      General Comments        Pertinent Vitals/Pain Pain Assessment: No/denies pain    Home Living                       Prior Function            PT Goals (current goals can now be found in the care plan section) Acute Rehab PT Goals PT Goal Formulation: All assessment and education complete, DC therapy Time For Goal Achievement: 04/10/19 Potential to Achieve Goals: Good Progress towards PT goals: Goals met/education completed, patient discharged from PT    Frequency    Other (Comment)(d/c)      PT Plan Discharge plan needs to be updated;Other (comment)(d/c today)    Co-evaluation              AM-PAC PT "6 Clicks" Mobility   Outcome Measure  Help needed turning from your back to your side while in a flat bed without using bedrails?: None Help needed moving from lying on your back to sitting on the side of a flat bed without using bedrails?: None Help needed moving to and from a bed to a chair (including a wheelchair)?: None Help needed standing up from a chair using your arms (e.g., wheelchair  or bedside chair)?: None Help needed to walk in hospital room?: None Help needed climbing 3-5 steps with a railing? : None 6 Click Score: 24    End of Session   Activity Tolerance: Patient tolerated treatment well Patient left: in bed;with call bell/phone within reach;with nursing/sitter in room Nurse Communication: Mobility status PT Visit Diagnosis: Dizziness and giddiness (R42)     Time: 2883-3744 PT Time Calculation (min) (ACUTE ONLY): 24 min  Charges:  $Gait Training: 8-22 mins $Therapeutic Activity: 8-22 mins                     Sherie Don, PT, DPT  Acute Rehabilitation Services Pager [phone] Office [place] 03/28/2019, 12:37 PM

## 2019-03-28 NOTE — Progress Notes (Signed)
Physical Therapy Discharge Patient Details Name: Angela [name] MRN: [id number] DOB: 10/28/2001 Today's Date: 03/28/2019 Time: 0721-8288 PT Time Calculation (min) (ACUTE ONLY): 24 min  Patient discharged from PT services secondary to goals met and no further PT needs identified.  Please see latest therapy progress note for current level of functioning and progress toward goals.    Progress and discharge plan discussed with patient and/or caregiver: Patient/Caregiver agrees with plan  GP     [place] 03/28/2019, 12:57 PM

## 2019-03-28 NOTE — Progress Notes (Signed)
LATE ENTRY:  Rec. Therapist visited pt yesterday (03/28/19)  morning in her room. Pt requested to paint again after lunch. Brought pt paint supplies and lap table yesterday after lunch. Will continue to follow.

## 2019-03-29 ENCOUNTER — Telehealth: Payer: Self-pay | Admitting: Family

## 2019-03-29 LAB — BASIC METABOLIC PANEL
Anion gap: 5 (ref 5–15)
BUN: 19 mg/dL (ref 6–20)
CO2: 30 mmol/L (ref 22–32)
Calcium: 9.3 mg/dL (ref 8.9–10.3)
Chloride: 105 mmol/L (ref 98–111)
Creatinine, Ser: 0.66 mg/dL (ref 0.44–1.00)
GFR calc Af Amer: >60 mL/min (ref >60)
GFR calc non Af Amer: >60 mL/min (ref >60)
Glucose, Bld: 82 mg/dL (ref 70–99)
Potassium: 4.4 mmol/L (ref 3.5–5.1)
Sodium: 140 mmol/L (ref 135–145)

## 2019-03-29 LAB — MAGNESIUM: Magnesium: 2.2 mg/dL (ref 1.7–2.4)

## 2019-03-29 LAB — PHOSPHORUS: Phosphorus: 3.9 mg/dL (ref 2.5–4.6)

## 2019-03-29 NOTE — Progress Notes (Signed)
FOLLOW UP PEDIATRIC/NEONATAL NUTRITION ASSESSMENT Date: 03/29/2019   Time: 11:28 AM   RD working remotely.  Reason for Assessment: Eating disorder, Consult for assessment of nutrition requirements/status  ASSESSMENT: Female 18 y.o.   Admission Dx/Hx:  18 y.o. female who presents with bradycardia and hypotension with a history of anorexia nervosa.  Weight: 40.6 kg(standing scale with blue gown/socks/no panties)(0.21%) Length/Ht: 5\' 2"  (157.5 cm) (19%) Body mass index is 16.37 kg/m. Plotted on CDC growth chart  Assessment of Growth: Pt meets criteria for MODERATE MALNUTRITION as evidenced by BMI for age z-score of -2.05.  Estimated Needs:  47 ml/kg 49-54 Kcal/kg 2000-2200 calories/day 1.5-2 g Protein/kg   Meal completion has been 100%. Appetite has been good. Pt with a 100 gram weight gain from yesterday. Pt currently is meeting full nutrition needs. Handouts regarding home nutrition meal plan given and discussed yesterday. Nutrition education ongoing. Pt motivated to devise up sample meal plans by following RD meal plan guide for home.   RD to continue to monitor.   Urine Output: 5 mL/kg/hr  Related Meds: MVI, Ensure  Labs reviewed.  IVF:    NUTRITION DIAGNOSIS: -Malnutrition (NI-5.2) related to disordered eating as evidenced by BMI for age z-score of -2.05. Status: Ongoing  MONITORING/EVALUATION(Goals): PO intake Weight trends; goal of at least 100-200 gram gain/day Labs I/O's  INTERVENTION:   Ensure Enlive po PRN if meal completion inadequate, each supplement provides 350 kcal and 20 grams of protein.   Continue multivitamin once daily.   May allow bottle of mustard in patient's room.    May allow up to 9 cups of fluids daily. May allow 1 cup of ice chips TID. (MD may adjust as needed).   Home nutrition meal plan handouts have been given and discussed.  Patient is currently meeting full nutrition goals.   Roslyn Smiling, MS, RD, LDN Pager #  [phone] After hours/ weekend pager # [phone]

## 2019-03-29 NOTE — Progress Notes (Signed)
Angela [name] alert and interactive. Afebrile. HR irregular at times 50-70s. Other vitals WNL. Eating 100% meals and snacks. Meeting including [place] Citizen, Mom, Dr Lindie Spruce and Elon Jester, SW done. Sitter at bedside. Emotional support given.

## 2019-03-29 NOTE — Progress Notes (Signed)
Pediatric Teaching Program  Progress Note  Subjective  Patient seen this morning resting in bed. She reports the dry skin on her hands feels much better. She is still leaning towards outpatient therapy and treatment but is staying open-minded to inpatient. She has no complaints this morning and remains asymptomatic, despite her improving orthostatic hypotension.  Objective  Temp:  [97.3 F (36.3 C)-98.2 F (36.8 C)] 97.9 F (36.6 C) (04/23 0405) Pulse Rate:  [41-78] 53 (04/23 0500) Resp:  [11-19] 13 (04/23 0500) BP: (83-97)/(41-71) 90/58 (04/23 0500) SpO2:  [96 %-100 %] 98 % (04/23 0502) Weight:  [40.6 kg] 40.6 kg (04/23 0502) PHYSICAL EXAM General: No apparent distress, thin/frail appearing, pleasant female CV: RRR, S1S2 present, no murmurs, rubs or gallops; current HR 89 Pulm: CTA bilaterally, normal work of breathing  Abd: soft, nontender to palpation, no masses, positive bowel sounds Ext: thin extremities, well-perfused, no injury or deformity   Labs and studies were reviewed and were significant for: BMP Latest Ref Rng & Units 03/27/2019 03/26/2019 03/25/2019  Glucose 70 - 99 mg/dL 76 83 77  BUN 6 - 20 mg/dL 19 18 18   Creatinine 0.44 - 1.00 mg/dL 1.16 5.79 0.38  Sodium 135 - 145 mmol/L 140 140 137  Potassium 3.5 - 5.1 mmol/L 3.8 4.1 3.9  Chloride 98 - 111 mmol/L 103 104 104  CO2 22 - 32 mmol/L 27 28 28   Calcium 8.9 - 10.3 mg/dL 9.3 9.2 9.2  Mg: Phos: Orthostatic VS for the past 24 hrs:  BP- Lying Pulse- Lying BP- Sitting Pulse- Sitting BP- Standing at 0 minutes Pulse- Standing at 0 minutes  03/29/19 0502 (!) 83/49 54 90/58 62 91/51 101   Weight: 40.5kg > 40.6kg = gained 100kg  Assessment  Angela [name] is a 18 y.o. female admitted for  bradycardia and symptomatic hypotensionsecondary to severe anorexia. Her vitals continue to improve at baseline but the patient continues to have orthostatic hypotension. She continues to follow dietary instruction per nutrition.  Patient had in-depth discussion with adolescent medicine, social work, child psychology, and her mother to discuss options for future placement. Patient remains undecided but is leaning more towards outpatient medicine at this time.  Plan  Anorexia Nervosa, restricting type, stable: -Child psychology following - appreciate recommendations -Nutrition consulted for meal planning - appreciate recommendations. Nutrition was updated 4/21 regarding patient's increased activity and has adjusted meal plan accordingly. -Social work has provided information regarding treatment options and facilities. -PT consulted-signed off -Continue Zyprexa 2.5 mg with dinner -BMP every other day -Continuous cardiac monitoring -1:1 sitter -daily weights - 40.2kg > 40.5kg (gained 300g 4/22) -q4h BP and vitals. -qdaily orthostatics -Patient may shower standing up daily for less than 5 minutes.  Dry skin: seen on knuckles of bilateral hands. -Aquaphor to affected areas BID PRN  Abnormal Thyroid Studies:rT3has returned within normal range. We will follow-up with thyroid studies and assess need for treatment at a later date.  -Endocrinology following - also recommend no treatment at this time. -Repeat thyroid studies around May 1st  Interpreter present: no   LOS: 7 days   Dollene Cleveland, DO 03/29/2019, 7:28 AM

## 2019-03-29 NOTE — Progress Notes (Signed)
Nutrition Brief Note  RD working remotely.  Nursing and care team may modify food meal trays to match list below if tray brought up from kitchen differs.  List of food items RD has ordered at meals:  Thursday 4/23:  Mid-morning snack at 10 am: 1 orange  Lunch to arrive at noon: 8 ounces soy milk 2 fruit cups 2 servings of salad 1 packets of Balsamic vinaigrette dressing 2 servings of rice krispies cereal 2 servings of scrambled eggs 1 cup decaf coffee 10 oz bottle water  Snack to arrive at 2pm: 1 apple  Dinner to arrive at 5pm: 8 ounces soy milk 2 fruit cups 1 banana 2 servings salad 1 packet of Balsamic vinegrette dressing 9 slices deli [place] meat with no bread 6 packets mustard 1 cup decaf coffee 10 oz bottle water  Friday 4/24: Breakfast to arrive at 7:30am: 8 ounces soy milk 1 apple 1 cup of grapes 2 servings of cheerios 4 boiled eggs 1 cup decaf coffee 10 oz bottle water  Roslyn Smiling, MS, RD, LDN Pager # [phone] After hours/ weekend pager # [phone]

## 2019-03-29 NOTE — Progress Notes (Signed)
Zaryah slept most of the night waking for 0500 VS. VS at baseline. Lower level parameters changed to 40 bpm. Pt. asymtomatic at 0500 orthostatic VS. Pt. Appeared engaged and interactive with staff. No visitors this shift. No request for  PO fluids during evening shift and did not request any PO medication. Weight increased by 0.1kg. Pt. Showered after 0500 VS. AM labs to be drawn.

## 2019-03-29 NOTE — Progress Notes (Signed)
Family Care Conference     Angela [name], Social Worker     Lequita Halt, Chiropodist    T. Haithcox, Director    N. Ermal[facility]     A. Lanice Schwab Resident     Attending:  Dr. Leotis Shames Nurse: B. Honeycutt, RN  Plan of Care: Pt requests intensive outpatient treatment for her anorexia. If all out patient visits are in place will plan for discharge prior to Monday.

## 2019-03-29 NOTE — Progress Notes (Signed)
Angela [name] had a lengthy talk with me in the morning, emphasizing how much she wanted Angela [name]'s support but also that she strongly feels she can do an out-patient treatment plan at home. We discussed how she might help [name] understand by giving [name] specifics about a potential outpatient plan. I also challenged Angela to think again about making more contact with eating disorder programs in order to help Angela [name] know that Angela [name] would like to try an outpatient program but also agrees that if this is not sufficient she agrees to do inpatient.  Time 18 minutes  Along with Angela [name], our social worker I spoke with Angela Low, NP [name] about the specifics of how she would implement an out-patient plan for treatment. She would give responsibility to Chicken with appointments made and kept and with specific markers of progress including gaining 1/2 to 1 pound during the first week home. [name] is not responsible for meals, for counting calories, etc..., she is responsible for being a [name] and allowing Angela [name] to try this plan with close monitoring. Angela [name] will need to work with Angela out-patient team and not ask [name] to take responsibility for Angela eating disorder care.   At noon, Angela [name] and I met with Angela [name] and Angela [name] to talk together about potential plans after discharge. Angela [name] has been asked to write up a contract of what the expectations are for successful outpatient treatment. She will bring this to Angela first appointment with [name]. We reviewed many options with [name] in order to help Angela step away and allow Angela [name] and Angela out-patient team to work together. We discussed meals, planning, shopping, preparing, cleaning up, appointment scheduling, driving, exercise,  etc... At the end of the meeting [name] appeared relieved and felt she had the support to go forward. She will request a therapist for herself. Angela [name] will speak to the [name] at 2:00 pm to continue to gather  information about inpatient and intensive virtual out-patient programs. Angela [name] then willl speak to [name] later today. Potential discharge plan is Saturday with follow-up with Angela [name] on Monday. We will clarify all appointments tomorrow.   Time 60 minutes

## 2019-03-29 NOTE — Telephone Encounter (Signed)
Discussed patient with [name], FNP. Patient needs to be set up for follow up with Neysa Bonito, nutrition as well as therapy as soon as possible and information needs to be provided to care team at [facility].   Patient scheduled for video follow up with Neysa Bonito this coming Monday morning, 04/02/2019 at 10AM  I LVM for Megan at [facility] and also sent the following message through their online portal on website:  Hello, I left a voicemail for Aundra Millet but wanted to follow up this way as well. This patient is currently inpatient at [facility] and her care team would like nutrition and therapy appointments to be scheduled by tomorrow if at all possible. Her provider at our clinic, [facility], is [name], FNP. She is scheduled to see her on Monday morning at 10AM.  Her DOB is 11-Sep-2001 and the insurance is [facility]. Please schedule this patient for the soonest available appointment you have this upcoming week and you can call me or email me with the appointment time. [phone] is my direct line and [email or web address] is my email address. Thank you!  I LVM for Melissa at [facility] and also sent the following email to the address on their website:  Hey there, I left a voicemail for [facility] but wanted to follow up this way as well. This patient is currently inpatient at [facility] and her care team would like nutrition and therapy appointments to be scheduled by tomorrow if at all possible. I have also reached out to nutrition for scheduling as well. Her provider at our clinic, [facility], is [name], FNP. She is scheduled to see her on Monday morning at 10AM.  Her DOB is 11-Nov-2001 and the insurance is [facility]. Please schedule this patient for the soonest available appointment you have this upcoming week and you can call me or email me with the appointment time. [phone] is my direct line and [email or web address] is my email address. Thank you!

## 2019-03-29 NOTE — Telephone Encounter (Signed)
[facility] counselors are not in network with [employer]. LVM for Mike Craze to check availability. Checked with Mathis Dad, but she is not taking new patients. Also checked website for April Foresbrey, but she is also not on panel with [facility]. Have not heard back from [facility] yet. Have been discussing with [name], FNP. May schedule with [facility] at our clinic for webex therapy until we are able to get her connected with a therapist who has DE experience.

## 2019-03-29 NOTE — Progress Notes (Signed)
CSW spoke with adolescent medicine provider, [name], NP, this morning for update. Ms. Yetta Barre states she had discussed option of virtual Intensive Outpatient Program with patient as patient continues to decline the medical recommendation of inpatient care. Ms. Yetta Barre asked CSW to follow up with patient regarding options for IOP. CSW spoke with patient at length and reviewed options of treatment facilities and levels of care. Patient agreeable to calling facilities for additional information. Patient expressed most interest in [facility] program located in [place], [place]. CSW and patient spoke with general admissions and then patient received call back from admissions for Eating Disorders program. Patient was able to ask specific questions about both the residential program and virtual IOP at [place]. Patient also agreed to phone assessment for [facility] programs which was then scheduled for 2pm today. Patient expressed to CSW that she felt like "a virtual IOP might be a happy medium and my Mom would be ok with that." Patient expressed that she still is considering residential. CSW will continue to follow, assist as needed.   Gerrie Nordmann, LCSW [phone]

## 2019-03-30 NOTE — Telephone Encounter (Signed)
Patient is scheduled with both Angela [name] and [name], behavioral health clinician, for this upcoming week. I have been in communication and staff at [facility] and am waiting to receive appointment information via phone or email.

## 2019-03-30 NOTE — Progress Notes (Signed)
Angela [name] had showered and completed her breakfast. We reviewed the contract she has written and I FAXed it to [name], FNP [name]. Angela [name] will take her copy with her for her Monday 10:00 am appointment. She is aware that she has a video therapy appointment at 10:30 on Tuesday with [name]. Nutrition appointment is still to be scheduled by [name]. Angela [name] is appropriately anxious and nervous to go home and begin this out-patient trial of care.  [name]

## 2019-03-30 NOTE — Discharge Instructions (Signed)
1.  Continue taking Zyprexa 2.5 mg daily with dinner. 2.  Follow-up with [name] with adolescent medicine on Monday, 4/27 at 10 AM 3.  Follow-up with [name], LCSW for video therapy appointment on Tuesday, 4/28 at 10:30 AM. 4.  Continue to follow feeding plan per instructions provided, supplementing with Ensure for any unmet eating goals. 5.  Continue to limit physical activity to a 5 minutes standing shower and short walk daily.

## 2019-03-30 NOTE — Progress Notes (Signed)
CSW spoke with patient this morning in her room, offered continued emotional support and checked in with patient about her phone assessment with Daniel Nones yesterday. Patient states she completed assessment and it was "just intake stuff." Patient stated she is still "leaning" towards outpatient community plan but is also considering continuing with application process for residential and virtual IOP for [place]. CSW processed with patient her hesitation about following through as well as benefits of doing so.  Patient stated she will consider completing the intake forms. Plan is for discharge tomorrow. Community appointments for adolescent medicine, nutrition, and therapy have been established.   Gerrie Nordmann, LCSW [phone]

## 2019-03-30 NOTE — Progress Notes (Addendum)
   03/30/19 1500  Clinical Encounter Type  Visited With Health care provider;Patient  Visit Type Follow-up;Social support;Psychological support  Spiritual Encounters  Spiritual Needs Emotional  Stress Factors  Patient Stress Factors Loss of control;Major life changes   F/u visit w/ pt, talked about how she felt about upcoming treatment plans, what she thinks will be different this time.  ETA:  Late entry:  Visit was afternoon of Thurs, April 23.  Margretta Sidle resident, [phone]

## 2019-03-30 NOTE — Progress Notes (Addendum)
Nutrition Brief Note  RD working remotely.  Nursing and care team may modify food meal trays to match list below if tray brought up from kitchen differs.  List of food items RD has ordered at meals:  Friday 4/24:  Mid-morning snack at 10 am: 1 orange  Lunch to arrive at noon: 8 ounces soy milk 2 fruit cups 2 servings of salad 1 packets of Balsamic vinaigrette dressing 2 servings of rice krispies cereal 2 servings of scrambled eggs 1 cup decaf coffee 10 oz bottle water  Snack to arrive at 2pm: 1 apple  Dinner to arrive at 5pm: 8 ounces soy milk 2 fruit cups 1 banana 2 servings salad 1 packet of Balsamic vinegrette dressing 9 slices deli [place] meat with no bread 6 packets mustard 1 cup decaf coffee 10 oz bottle water  Saturday 4/25: Breakfast to arrive at 7:30am: 8 ounces soy milk 1 apple 1 fruit cup 1 banana 2 servings of rice krispies cereal 3 boiled eggs 1 cup decaf coffee 10 oz bottle water  Mid-morning snack at 10 am: 1 orange  Lunch to arrive at noon: 8 ounces soy milk 1 fruit cup 1 cup grapes 2 servings of salad 1 packets of Balsamic vinaigrette dressing 2 servings of cheerios cereal 2 servings of scrambled eggs 1 cup decaf coffee 10 oz bottle water  Snack to arrive at 2pm: 1 apple  Dinner to arrive at 5pm: 8 ounces soy milk 2 fruit cups 1 banana 2 servings salad 1 packet of Balsamic vinegrette dressing 9 slices deli [place] meat with no bread 6 packets mustard 1 cup decaf coffee 10 oz bottle water  Roslyn Smiling, MS, RD, LDN Pager # [phone] After hours/ weekend pager # [phone]

## 2019-03-30 NOTE — Progress Notes (Signed)
FOLLOW UP PEDIATRIC/NEONATAL NUTRITION ASSESSMENT Date: 03/30/2019   Time: 11:16 AM   RD working remotely.  Reason for Assessment: Eating disorder, Consult for assessment of nutrition requirements/status  ASSESSMENT: Female 18 y.o.   Admission Dx/Hx:  18 y.o. female who presents with bradycardia and hypotension with a history of anorexia nervosa.  Weight: 40.9 kg(0.28%) Length/Ht: 5\' 2"  (157.5 cm) (19%) Body mass index is 16.49 kg/m. Plotted on CDC growth chart  Assessment of Growth: Pt meets criteria for MODERATE MALNUTRITION as evidenced by BMI for age z-score of -2.05.  Estimated Needs:  47 ml/kg 49-54 Kcal/kg 2000-2200 calories/day 1.5-2 g Protein/kg   Meal completion has been 100%. Appetite has been good. Pt with a 300 gram weight gain from yesterday. Pt currently is meeting full nutrition needs. Handouts regarding home nutrition meal plan have been given and discussed. Question related to home nutrition plan answered. Pt expressed understanding of nutrition plan. Plans for discharge tomorrow with outpatient trial.   Urine Output: 4 mL/kg/hr  Related Meds: MVI, Ensure  Labs: N/A  IVF:    NUTRITION DIAGNOSIS: -Malnutrition (NI-5.2) related to disordered eating as evidenced by BMI for age z-score of -2.05. Status: Ongoing  MONITORING/EVALUATION(Goals): PO intake Weight trends; goal of at least 100-200 gram gain/day Labs I/O's  INTERVENTION:   Ensure Enlive po PRN if meal completion inadequate, each supplement provides 350 kcal and 20 grams of protein.   Continue multivitamin once daily.   May allow bottle of mustard in patient's room.    May allow up to 9 cups of fluids daily. May allow 1 cup of ice chips TID. (MD may adjust as needed).   Home nutrition meal plan handouts have been given and discussed.  Patient is currently meeting full nutrition goals.   Roslyn Smiling, MS, RD, LDN Pager # [phone] After hours/ weekend pager # [phone]

## 2019-03-30 NOTE — Progress Notes (Signed)
End of shift: Pt. Has ate all foods and snacks .She has 1 fruit salad in fridge labeled. Has consumed 5 cups water can have 4 more cups. Output has been 2050 mL. Labs ,weight, and orthostatic at 0500.

## 2019-03-30 NOTE — Progress Notes (Signed)
Pediatric Teaching Program  Progress Note  Subjective  Patient seen this morning sitting upright in bed finishing her breakfast.  States she slept well last night and got a good shower this morning.  Denies any symptoms with standing or ambulating.  Remained asymptomatic while orthostatic vitals were being taken.  She is finishing her meals.  Denies lightheadedness, headaches, fatigue, chest pain, shortness of breath, abdominal pain, nausea, vomiting, and bowel changes.  Objective  Temp:  [97.4 F (36.3 C)-98.2 F (36.8 C)] 97.7 F (36.5 C) (04/24 0000) Pulse Rate:  [44-87] 44 (04/24 0000) Resp:  [11-24] 11 (04/24 0000) BP: (86-100)/(55-71) 86/56 (04/24 0000) SpO2:  [94 %-100 %] 98 % (04/24 0000) Weight:  [40.9 kg] 40.9 kg (04/24 0450) General:No apparent distress, thin/frail appearing, pleasant female CV:RRR, S1S2 present, no murmurs, rubs or gallops; current HR89 Pulm:CTA bilaterally, normal work of breathing HDQ:QIWL, nontender to palpation, no masses, positive bowel sounds NLG:XQJJ extremities, well-perfused, no injury or deformity, improved dryness to hands/knuckles  Labs and studies were reviewed and were significant for: BMP Latest Ref Rng & Units 03/29/2019 03/27/2019 03/26/2019  Glucose 70 - 99 mg/dL 82 76 83  BUN 6 - 20 mg/dL 19 19 18   Creatinine 0.44 - 1.00 mg/dL 9.41 7.40 8.14  Sodium 135 - 145 mmol/L 140 140 140  Potassium 3.5 - 5.1 mmol/L 4.4 3.8 4.1  Chloride 98 - 111 mmol/L 105 103 104  CO2 22 - 32 mmol/L 30 27 28   Calcium 8.9 - 10.3 mg/dL 9.3 9.3 9.2  Mg 2.2 Phos 3.9 Orthostatic VS for the past 24 hrs:  BP- Lying Pulse- Lying BP- Sitting Pulse- Sitting BP- Standing at 0 minutes Pulse- Standing at 0 minutes  03/30/19 0507 - - (!) 86/54 53 (!) 84/58 75  03/30/19 0505 (!) 88/59 (!) 41 - - - -   Weight: 40.6 > 40.9kg (4/24)  Assessment  Angela [name] is a 18 y.o. female admitted for bradycardia and symptomatic hypotensionsecondary to severe anorexia.  Her vitals continue to improve at baseline but the patient continues to have orthostatic hypotension. She continues to follow dietary instruction per nutrition. Patient had in-depth discussion with adolescent medicine, social work, child psychology, and her mother to discuss options for future placement. Patient is leaning more towards outpatient medicine at this time.  Plan  Anorexia Nervosa, restricting type, stable: -Child psychology following - appreciate recommendations -Nutrition consulted for meal planning - appreciate recommendations.  -Social work consulted and following -PT consulted-signed off -Continue Zyprexa 2.5 mg with dinner -BMP every other day -Continuous cardiac monitoring -1:1 sitter -daily weights- 40.6kg > 40.9kg (gained 300g 4/24) -q4h BP and vitals. -qdaily orthostatics - patient remains asymptomatic -Patient may shower standing up daily for less than 5 minutes and may take 1 lap around the floor daily  Dry skin, improved: seen on knuckles of bilateral hands. -Aquaphor to affected areas BID PRN  Abnormal Thyroid Studies:rT3has returned within normal range. We will follow-up with thyroid studies and assess need for treatment at a later date.  -Endocrinology following- also recommend no treatment at this time. -Repeat thyroid studies around May 1st  Interpreter present: no   LOS: 8 days   Dollene Cleveland, DO 03/30/2019, 7:26 AM

## 2019-03-31 LAB — BASIC METABOLIC PANEL
Anion gap: 8 (ref 5–15)
BUN: 19 mg/dL (ref 6–20)
CO2: 28 mmol/L (ref 22–32)
Calcium: 9.6 mg/dL (ref 8.9–10.3)
Chloride: 104 mmol/L (ref 98–111)
Creatinine, Ser: 0.67 mg/dL (ref 0.44–1.00)
GFR calc Af Amer: >60 mL/min (ref >60)
GFR calc non Af Amer: >60 mL/min (ref >60)
Glucose, Bld: 115 mg/dL — ABNORMAL HIGH (ref 70–99)
Potassium: 4.2 mmol/L (ref 3.5–5.1)
Sodium: 140 mmol/L (ref 135–145)

## 2019-03-31 LAB — CBC WITH DIFFERENTIAL/PLATELET
Abs Immature Granulocytes: 0.03 10*3/uL (ref 0.00–0.07)
Basophils Absolute: 0 10*3/uL (ref 0.0–0.1)
Basophils Relative: 1 %
Eosinophils Absolute: 0.1 10*3/uL (ref 0.0–0.5)
Eosinophils Relative: 4 %
HCT: 35.4 % — ABNORMAL LOW (ref 36.0–46.0)
Hemoglobin: 12.1 g/dL (ref 12.0–15.0)
Immature Granulocytes: 1 %
Lymphocytes Relative: 29 %
Lymphs Abs: 1 10*3/uL (ref 0.7–4.0)
MCH: 37.8 pg — ABNORMAL HIGH (ref 26.0–34.0)
MCHC: 34.2 g/dL (ref 30.0–36.0)
MCV: 110.6 fL — ABNORMAL HIGH (ref 80.0–100.0)
Monocytes Absolute: 0.3 10*3/uL (ref 0.1–1.0)
Monocytes Relative: 9 %
Neutro Abs: 1.9 10*3/uL (ref 1.7–7.7)
Neutrophils Relative %: 56 %
Platelets: 183 10*3/uL (ref 150–400)
RBC: 3.2 MIL/uL — ABNORMAL LOW (ref 3.87–5.11)
RDW: 12.7 % (ref 11.5–15.5)
WBC: 3.5 10*3/uL — ABNORMAL LOW (ref 4.0–10.5)
nRBC: 0 % (ref 0.0–0.2)

## 2019-03-31 LAB — RETICULOCYTES
Immature Retic Fract: 8.5 % (ref 2.3–15.9)
RBC.: 3.2 MIL/uL — ABNORMAL LOW (ref 3.87–5.11)
Retic Count, Absolute: 93.4 10*3/uL (ref 19.0–186.0)
Retic Ct Pct: 2.9 % (ref 0.4–3.1)

## 2019-03-31 LAB — MAGNESIUM: Magnesium: 2.1 mg/dL (ref 1.7–2.4)

## 2019-03-31 LAB — PHOSPHORUS: Phosphorus: 3.9 mg/dL (ref 2.5–4.6)

## 2019-03-31 MED ORDER — ENSURE ENLIVE PO LIQD: 237.0000 mL | ORAL | 12 refills | Status: DC | PRN

## 2019-03-31 MED ORDER — OLANZAPINE 2.5 MG PO TABS: 2.5000 mg | ORAL_TABLET | Freq: Every day | ORAL | 0 refills | Status: DC

## 2019-04-02 ENCOUNTER — Ambulatory Visit (INDEPENDENT_AMBULATORY_CARE_PROVIDER_SITE_OTHER): Payer: 59 | Admitting: Family

## 2019-04-02 ENCOUNTER — Other Ambulatory Visit: Payer: Self-pay

## 2019-04-02 VITALS — BP 100/60 | HR 81 | Ht 63.0 in | Wt 96.0 lb

## 2019-04-02 DIAGNOSIS — Z1389 Encounter for screening for other disorder: Secondary | ICD-10-CM | POA: Diagnosis not present

## 2019-04-02 DIAGNOSIS — E440 Moderate protein-calorie malnutrition: Secondary | ICD-10-CM

## 2019-04-02 DIAGNOSIS — R001 Bradycardia, unspecified: Secondary | ICD-10-CM

## 2019-04-02 DIAGNOSIS — F5001 Anorexia nervosa, restricting type: Secondary | ICD-10-CM

## 2019-04-02 LAB — POCT URINALYSIS DIPSTICK
Bilirubin, UA: NEGATIVE
Blood, UA: NEGATIVE
Glucose, UA: NEGATIVE
Ketones, UA: NEGATIVE
Leukocytes, UA: NEGATIVE
Nitrite, UA: NEGATIVE
Protein, UA: NEGATIVE
Spec Grav, UA: 1.01 (ref 1.010–1.025)
Urobilinogen, UA: 1 E.U./dL
pH, UA: 6.5 (ref 5.0–8.0)

## 2019-04-02 NOTE — Progress Notes (Signed)
History was provided by the patient.  Angela [name] is a 18 y.o. female who is here for follow up s/p hospital discharge for anorexia nervosa, restricting type.   PCP confirmed? Yes.    Truddie Coco, MD   HPI:   Pellerito returns to clinic to restart outpatient care for DE s/p admission from 04/15-04/25.  -She has been home from hospital since Saturday -She has a copy of the contract she signed -Gives me her treadmill key for safekeeping until she is cleared for physical activity  -Has therapy and nutrition appts set up  -no si/hi -continues on zyprexa 2.5 mg  -really tired if she takes it later than just after dinner  Review of Systems  Constitutional: Negative for fever and malaise/fatigue.  Respiratory: Negative for cough and shortness of breath.   Cardiovascular: Negative for chest pain.  Gastrointestinal: Negative for abdominal pain and vomiting.  Genitourinary: Negative for dysuria and frequency.  Musculoskeletal: Negative for myalgias.  Skin: Negative for rash.  Neurological: Negative for dizziness and headaches.  Psychiatric/Behavioral: The patient is nervous/anxious.       Patient Active Problem List   Diagnosis Date Noted  . Macrocytosis   . Bradycardia 08/14/2018  . Hypophosphatemia 08/09/2018  . Eating disorder 08/08/2018  . Moderate protein-calorie malnutrition ([place]) 08/08/2018  . Chronic bilateral low back pain without sciatica 02/05/2018    Current Outpatient Medications on File Prior to Visit  Medication Sig Dispense Refill  . Multiple Vitamin (MULTIVITAMIN WITH MINERALS) TABS tablet Take 1 tablet by mouth daily. 30 tablet 0  . OLANZapine (ZYPREXA) 2.5 MG tablet Take 1 tablet (2.5 mg total) by mouth daily. 90 tablet 0  . feeding supplement, ENSURE ENLIVE, (ENSURE ENLIVE) LIQD Take 237 mLs by mouth as needed (if meal goal not met). (Patient not taking: Reported on 04/02/2019) 237 mL 12   No current facility-administered medications on file prior to visit.      No Known Allergies  Physical Exam:    Vitals:   04/02/19 1010  BP: 100/60  Pulse: 81  Weight: 96 lb (43.5 kg)  Height: _0  (1.6 m)   Wt Readings from Last 3 Encounters:  04/02/19 96 lb (43.5 kg) (2 %, Z= -2.12)*  03/31/19 90 lb 6.2 oz (41 kg) (<1 %, Z= -2.75)*  03/21/19 93 lb 9.6 oz (42.5 kg) (<1 %, Z= -2.37)*   * Growth percentiles are based on CDC (Girls, 2-20 Years) data.     Blood pressure percentiles are not available for patients who are 18 years or older. No LMP recorded.  Physical Exam Vitals signs reviewed.  Constitutional:      Appearance: She is not ill-appearing.  HENT:     Nose: No congestion.     Mouth/Throat:     Mouth: Mucous membranes are moist.     Pharynx: No posterior oropharyngeal erythema.  Eyes:     Extraocular Movements: Extraocular movements intact.     Pupils: Pupils are equal, round, and reactive to light.  Neck:     Musculoskeletal: Normal range of motion.  Cardiovascular:     Rate and Rhythm: Normal rate and regular rhythm.     Heart sounds: No murmur.  Pulmonary:     Effort: Pulmonary effort is normal.  Lymphadenopathy:     Cervical: No cervical adenopathy.  Skin:    General: Skin is dry.     Coloration: Skin is pale.  Neurological:     General: No focal deficit present.     Mental  Status: She is alert and oriented to person, place, and time.  Psychiatric:        Mood and Affect: Mood is anxious.     Assessment/Plan: 1. Anorexia nervosa, restricting type -her weight is 3 lbs up from when I saw her in the office prior to her hospital admission, vitals improved today  -reviewed contract, praise given for her work on the contract and also in given the key today; reviewed no physical activity and restoration is 0.5-1.0 lbs per week    Bradycardia improved. Continue restoration for malnutrition.

## 2019-04-03 ENCOUNTER — Encounter: Payer: Self-pay | Admitting: Family

## 2019-04-03 ENCOUNTER — Ambulatory Visit: Payer: Self-pay | Admitting: Registered"

## 2019-04-03 ENCOUNTER — Ambulatory Visit (INDEPENDENT_AMBULATORY_CARE_PROVIDER_SITE_OTHER): Payer: 59 | Admitting: Licensed Clinical Social Worker

## 2019-04-03 DIAGNOSIS — F4329 Adjustment disorder with other symptoms: Secondary | ICD-10-CM

## 2019-04-03 NOTE — BH Specialist Note (Signed)
Integrated Behavioral Health Comprehensive Clinical Assessment  MRN: [id number] Name: Angela [name] via Telemedicine Video Visit  Session Start time: 10:36 AM Session End time: 11:10AM Total time: 34 minutes  Feeling pretty good overall.  Meeting goals per patient  Referring Provider: Hoyt Koch, NP Type of Visit: Video Patient/Family location: Home [facility] Provider location: Remote home office All persons participating in visit: Patient and [facility]  Confirmed patient's address: Yes  Confirmed patient's phone number: Yes  Any changes to demographics: No   Confirmed patient's insurance: Yes  Any changes to patient's insurance: No   Discussed confidentiality: Yes   I connected withr Angela [name] by a video enabled telemedicine application and verified that I am speaking with the correct person using two identifiers.     I discussed the limitations of evaluation and management by telemedicine and the availability of in person appointments.  I discussed that the purpose of this visit is to provide behavioral health care while limiting exposure to the novel coronavirus.   Discussed there is a possibility of technology failure and discussed alternative modes of communication if that failure occurs.  I discussed that engaging in this video visit, they consent to the provision of behavioral healthcare and the services will be billed under their insurance.  Patient and/or legal guardian expressed understanding and consented to video visit: Yes   PRESENTING CONCERNS: Patient and/or family reports the following symptoms/concerns: Doesn't want to go to residential, connecting for counseling for support Duration of problem: Ongoing; Severity of problem: moderate  I discussed the assessment and treatment plan with the patient and/or parent/guardian. They were provided an opportunity to ask questions and all were answered. They agreed with the plan and  demonstrated an understanding of the instructions.   They were advised to call back or seek an in-person evaluation if the symptoms worsen or if the condition fails to improve as anticipated.  Type of Service: Integrated Behavioral Health-Individual Interpretor: No. Interpretor Name and Language: N/A  PRESENTING CONCERNS: Angela [name] is a 18 y.o. female accompanied by patient only. Angela [name] was referred to [facility] clinician for disordered eating.  Previous mental health services Have you ever been treated for a mental health problem? Yes If "Yes", when were you treated and whom did you see? Residential in the past for eating disorder. Have you ever been hospitalized for mental health treatment? Yes Have you ever been treated for any of the following? Past Psychiatric History/Hospitalization(s): Anxiety: No Bipolar Disorder: No Depression: No Mania: No Psychosis: No Schizophrenia: No Personality Disorder: No Hospitalization for psychiatric illness: No History of Electroconvulsive Shock Therapy: NA Prior Suicide Attempts: No Have you ever had thoughts of harming yourself or others or attempted suicide? No plan to harm self or others  Medical history  has a past medical history of Anorexia and Bradycardia. Primary Care Physician: Default, Provider, MD Date of last physical exam: Unsure Dr. Truddie Coco Allergies: No Known Allergies Current medications:  Outpatient Encounter Medications as of 04/03/2019  Medication Sig  . feeding supplement, ENSURE ENLIVE, (ENSURE ENLIVE) LIQD Take 237 mLs by mouth as needed (if meal goal not met). (Patient not taking: Reported on 04/02/2019)  . Multiple Vitamin (MULTIVITAMIN WITH MINERALS) TABS tablet Take 1 tablet by mouth daily.  Marland Kitchen OLANZapine (ZYPREXA) 2.5 MG tablet Take 1 tablet (2.5 mg total) by mouth daily.   No facility-administered encounter medications on file as of 04/03/2019.    Have you ever had  any serious  medication reactions? No Is there any history of mental health problems or substance abuse in your family? No Has anyone in your family been hospitalized for mental health treatment? No  Social/family history Who lives in your current household? Angela [name] and Angela [name] What is your family of origin, childhood history? Parents divorced when patient was 37. Overall feels that childhood was "busy." Swimming, soccer, [employer]. School was easy, but not the best grades. Took AP classes. Where were you born? [place] Where did you grow up? [place] How many different homes have you lived in? 4 Describe your childhood: Busy- lots of sports Do you have siblings, step/half siblings? Yes- Sister and Brother What are their names, relation, sex, age? Brother (64) in [place], Sister Jac Canavan (16) Are your parents separated or divorced? Yes- when 15 What are your social supports? Friends, Angela [name] - Friends are funny, bring out the best in me. Able to relax with friends. Genuine self .  Education How many grades have you completed? 12th grade Did you have any problems in school? No- didn't like [employer]. Roommate was horrible. No structure, partying only.  Employment/financial issues Left [employer], didn't like it, Transferring to [employer]. Respiratory Therapy goals. Work with babies.  Sleep Usual bedtime is 8:30/9P- Get on phone, 10 minutes to fall asleep. Awake around 5:30AM -social media time, sunrise. Sleeping arrangements: In own bed, own room. Problems with snoring: No Obstructive sleep apnea is not a concern. Problems with nightmares: No Problems with night terrors: No Problems with sleepwalking: No  Trauma/Abuse history Have you ever experienced or been exposed to any form of abuse? No Have you ever experienced or been exposed to something traumatic? No  Substance use Do you use alcohol, nicotine or caffeine? no tobacco use Caffeine- coffee no alcohol How old were you when you first tasted  alcohol? 18 years old Have you ever used illicit drugs or abused prescription medications? no  Mental status General appearance/Behavior: Neat Eye contact: Good Motor behavior: Normal Speech: Normal Level of consciousness: Alert Mood: Euthymic Affect: Appropriate Anxiety level: None Thought process: Coherent Thought content: WNL Perception: Normal Judgment: Good Insight: Present  Diagnosis Anorexia  GOALS ADDRESSED: Patient will reduce symptoms of: behavior concerns and increase knowledge and/or ability of: coping skills and also: Increase healthy adjustment to current life circumstances and Increase adequate support systems for patient/family              INTERVENTIONS: Interventions utilized: Solution-Focused Strategies, Supportive Counseling and Psychoeducation and/or Health Education Standardized Assessments completed: Not Needed   ASSESSMENT/OUTCOME: Goal: Relaxed and there if you need anything  Honest opinion no sugar coating    Contract/agreement  -Be checking in 1 x week  Ultimate goal: More willing and open to doing things that made me uncomfortable in the past. New experiences dont always make me uncomfortable, but changing expectations do.  If plans change on the fly - being able to adjust expectations and reactions.   Food and body = be more comfortable - would know I was there if easliy being able to eat stuff that makes me uncomfortable.   Goals Water Sleep -goal = 8 hours Good food and enough food    PLAN: Weekly check in and support  Scheduled next visit: [place]

## 2019-04-04 ENCOUNTER — Ambulatory Visit: Payer: 59 | Admitting: Registered"

## 2019-04-05 ENCOUNTER — Other Ambulatory Visit: Payer: Self-pay

## 2019-04-05 ENCOUNTER — Encounter: Payer: Self-pay | Admitting: Family

## 2019-04-05 ENCOUNTER — Encounter: Payer: Self-pay | Attending: Family | Admitting: Registered"

## 2019-04-05 DIAGNOSIS — F5001 Anorexia nervosa, restricting type: Secondary | ICD-10-CM | POA: Insufficient documentation

## 2019-04-05 NOTE — Progress Notes (Signed)
Appointment start time: 8:05  Appointment end time: 9:07  Patient was seen on 04/05/2019 for nutrition counseling pertaining to disordered eating  Primary care provider: Dr. Willa Roughueben Therapist: Carollee HerterShannon at adolescent medicine  ROI: N/A Any other medical team members: adolescent Parents: none   Assessment  Pt states drinks Ensure daily prefers Pediasure due to taste. Pt was curious was Ripple Ripple protein drink as a replacement for Ensure. Pt reports [facility] was sad and depressing. 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 Pt reports she has to pass her physical on 05/15.   Pt states she likes yoga, to run long distances, nanny, babysit, and cook. Pt states she used to swim in high school; year-round swimming. Pt reports she didn't really have menstrual cycle when swimming. Noticed when she was injured for 2 months her cycle would return.   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 Pt states she was getting protein from processed items rather than chicken and other animal sources. Pt states she wanted to eat better but it wasn't enough.   Pt states she likes almond milk taste rather than regular milk taste. Will have Lactaid when having dairy products such as yogurt. Not a sweets person, prefers savory.   Planning to attend [employer] in the fall.    Growth Metrics: none included Median BMI for age: 2321.5 BMI today: 17.01 % median today:  79% Goal rate of weight gain:  0.5-1.0/week  Eating history: Length of time: Jan-Feb 2019 Previous treatments: once at [facility] for 3 days Goals for RD meetings: improve menstrual period, cold tolerance, normalize food  Weight history:  Highest weight: 145   Lowest weight: 85 Most consistent weight: 105  What would you like to weigh: N/A How has weight changed in the past year: loss of 50 lbs  Medical Information:   Changes in hair, skin, nails since ED started: no Chewing/swallowing difficulties: no  Reflux or heartburn: no Trouble with teeth: no LMP without the use of hormones: Dec 2018  Weight at that point: 145  Effect of exercise on menses Decreased cycle  Constipation, diarrhea: yes constipation, when eating gluten 1-3 weeks. Has BM 1-2x/day  Dizziness/lightheadedness: no Headaches/body aches: no Heart racing/chest pain: no Mood: good Sleep: pretty good, sleeps 7-8 hrs/night Focus/concentration: good Cold intolerance: yes Vision changes: no  Mental health diagnosis: AN   Dietary assessment: A typical day consists of 3 meals and 3 snacks  Safe foods include: eggs, chicken, Mal[place]key, ham, fish, seafood, roast beef, all vegetables, all fruit, sweet potatoes, butternut squash, yogurt, soy milk, almond milk, cheerios,  Avoided foods include: gluten, dairy  24 hour recall:  B: 3 eggs + 2 servings cheerios + 1/2 coffee (creamer) + apple + chocolate almond milk S: apple + banana L: soymilk + 2 eggs + canned pumpkin + carrots + cheese + 1/2 coffee S: Ensure + cottage cheese + blueberries D: chicken + broccoli + almond milk + avocado S: yogurt  Beverage: coffee, tea, soy milk, almond milk, lemonade (occasionally), water (60 oz)   What Methods Do You Use To Control Your Weight (Compensatory behaviors)?           Restricting (calories, fat, carbs)  Exercise - running  Estimated energy intake: 2400-2600 kcal  Estimated energy needs: 2200 kcal 275 g CHO 110 g pro 73 g fat  Nutrition Diagnosis: NI-1.4  Inadequate energy intake As related to disordered eating.  As evidenced by weight loss and amenorrhea.  Intervention/Goals: Pt was educated and counseled on the benefits of nourishing and restoring her body. Pt was educated and counseled on meal components and balancing meals with a variety of food groups. Pt is motivated and wants to get better. Pt was in agreement with goals listed.   Goals: - Drink Ensure daily.  - Make 1/2 plate carbohydrates/starch, 1/4 plate protein, and 1/4 plate fruit/vegetable + calcium + fat.  - Continue doing a great job with eating 3 meals a day and having 3 snacks. Keep up the great work!  Meal plan:    3 meals    3 snacks  Monitoring and Evaluation: Patient will follow up in 1 weeks.

## 2019-04-05 NOTE — Patient Instructions (Addendum)
-   Drink Ensure daily.   - Make 1/2 plate carbohydrates/starch, 1/4 plate protein, and 1/4 plate fruit/vegetable + calcium + fat.   - Continue doing a great job with eating 3 meals a day and having 3 snacks. Keep up the great work!

## 2019-04-06 ENCOUNTER — Telehealth (INDEPENDENT_AMBULATORY_CARE_PROVIDER_SITE_OTHER): Payer: Self-pay | Admitting: Family

## 2019-04-06 ENCOUNTER — Encounter: Payer: Self-pay | Admitting: Family

## 2019-04-06 DIAGNOSIS — F5001 Anorexia nervosa, restricting type: Secondary | ICD-10-CM

## 2019-04-06 NOTE — Telephone Encounter (Addendum)
Family meeting on 03/26/2019 at 12:30 with patient and mother and treatment team. Video-phoned into meeting due to COVID-19 restrictions.  Team members present: Peggyann Shoals, MD, Cameron Ali, MD, Colvin Caroli, PhD, [facility], CSW.  Reviewed current medication dose: zyprexa 2.5 mg; Kiele reported sleepiness with this dose if she takes it at bedtime; advised to take it with evening meal.  Discussed recommendation for residential program; Marcelino Duster to seek information on options for Lachonda to further consider. Outpatient care is available with [name] team, nutrition and therapy locally, although strong recommendation for her to consider higher level of care.  May walk to bathroom. Chair shower tomorrow.   Late entry: 45 min meeting. I was on video on the phone of M. Margo Aye, MD for this meeting.   >30 minutes in care coordination with team prior to this family meeting and afterwards for discharge plan of care.

## 2019-04-10 ENCOUNTER — Other Ambulatory Visit: Payer: Self-pay

## 2019-04-10 ENCOUNTER — Ambulatory Visit (INDEPENDENT_AMBULATORY_CARE_PROVIDER_SITE_OTHER): Payer: Self-pay | Admitting: Licensed Clinical Social Worker

## 2019-04-10 DIAGNOSIS — F4329 Adjustment disorder with other symptoms: Secondary | ICD-10-CM

## 2019-04-10 NOTE — BH Specialist Note (Signed)
Integrated Behavioral Health via Telemedicine Video Visit  04/10/2019 ZAIAH GARBARINI [id number]  Number of Integrated Behavioral Health visits: 2nd Session Start time: 10:30A  Session End time: 10:56 AM  Total time: 26 minutes  Referring Provider: [name], NP Type of Visit: Video Patient/Family location: Patient's home [facility] Provider location: Remote home office All persons participating in visit: Patient  Confirmed patient's address: Yes  Confirmed patient's phone number: Yes  Any changes to demographics: No   Confirmed patient's insurance: Yes  Any changes to patient's insurance: No   Discussed confidentiality: Yes   I connected with Livinia Wrench Lalor's patient by a video enabled telemedicine application and verified that I am speaking with the correct person using two identifiers.     I discussed the limitations of evaluation and management by telemedicine and the availability of in person appointments.  I discussed that the purpose of this visit is to provide behavioral health care while limiting exposure to the novel coronavirus.   Discussed there is a possibility of technology failure and discussed alternative modes of communication if that failure occurs.  I discussed that engaging in this video visit, they consent to the provision of behavioral healthcare and the services will be billed under their insurance.  Patient and/or legal guardian expressed understanding and consented to video visit: Yes   PRESENTING CONCERNS: Patient and/or family reports the following symptoms/concerns: Feeling positive about her water intake and meeting some goals. Feeling overall ok. Duration of problem: Ongoing; Severity of problem: moderate  STRENGTHS (Protective Factors/Coping Skills): Motivated, goals  GOALS ADDRESSED: Patient will: 1.  Reduce symptoms of: stress  2.  Increase knowledge and/or ability of: coping skills, healthy habits and self-management skills  3.   Demonstrate ability to: Increase healthy adjustment to current life circumstances  INTERVENTIONS: Interventions utilized:  Solution-Focused Strategies, Brief CBT, Supportive Counseling and Psychoeducation and/or Health Education Standardized Assessments completed: Not Needed  ASSESSMENT: Patient currently experiencing proud of her increase of water, meal prepping different foods. Would like to increase her variety at meals. Goal: 7 meals over the next week will not be or include eggs.   Patient may benefit from continuing to see RD, NP and IBH. Discussion today about how patient has dealt with disappointment or perceived failure in the past - to think about patterns of behaviors, how family has responded in the past.  PLAN: 1. Follow up with behavioral health clinician on : 5/12 -1115 2. Behavioral recommendations: Continue with goals around water, food goals, grace for self 3. Referral(s): Integrated [employer] (In Clinic)  I discussed the assessment and treatment plan with the patient and/or parent/guardian. They were provided an opportunity to ask questions and all were answered. They agreed with the plan and demonstrated an understanding of the instructions.   They were advised to call back or seek an in-person evaluation if the symptoms worsen or if the condition fails to improve as anticipated.  [name]

## 2019-04-11 ENCOUNTER — Encounter: Payer: Self-pay | Admitting: Registered"

## 2019-04-11 ENCOUNTER — Encounter: Payer: No Typology Code available for payment source | Attending: Family | Admitting: Registered"

## 2019-04-11 ENCOUNTER — Ambulatory Visit (INDEPENDENT_AMBULATORY_CARE_PROVIDER_SITE_OTHER): Payer: Self-pay | Admitting: Family

## 2019-04-11 ENCOUNTER — Encounter: Payer: Self-pay | Admitting: Family

## 2019-04-11 VITALS — BP 95/64 | HR 88 | Ht 62.64 in | Wt 94.6 lb

## 2019-04-11 DIAGNOSIS — F5001 Anorexia nervosa, restricting type: Secondary | ICD-10-CM

## 2019-04-11 DIAGNOSIS — E440 Moderate protein-calorie malnutrition: Secondary | ICD-10-CM

## 2019-04-11 DIAGNOSIS — F50019 Anorexia nervosa, restricting type, unspecified: Secondary | ICD-10-CM

## 2019-04-11 DIAGNOSIS — Z1389 Encounter for screening for other disorder: Secondary | ICD-10-CM

## 2019-04-11 LAB — POCT URINALYSIS DIPSTICK
Bilirubin, UA: NEGATIVE
Blood, UA: NEGATIVE
Glucose, UA: NEGATIVE
Ketones, UA: NEGATIVE
Leukocytes, UA: NEGATIVE
Nitrite, UA: NEGATIVE
Protein, UA: NEGATIVE
Spec Grav, UA: <=1.005 — AB (ref 1.010–1.025)
Urobilinogen, UA: NEGATIVE E.U./dL — AB
pH, UA: 7 (ref 5.0–8.0)

## 2019-04-11 NOTE — Progress Notes (Signed)
History was provided by the patient.  Angela [name] is a 18 y.o. female who is here for Anorexia Nervosa, Restricting Type.   PCP confirmed? Yes.    Truddie Coco, MD   HPI:   -returns to clinic for weekly weight check/vitals  -following with RD (next appt today) and [place] for therapy (yesterday last visit)  -she has been doing yoga (+30 min) and she walked a mile yesterday (denied any other time walking). Reports that she has also been swinging on swing in park.  -a lot of anxiety about making sure she is doing everything correctly.  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 Her form would be do next week and camp would start June 25th week.  -she is tearful discussing how much camp means to her -she also mentions that she was eating a lot of eggs for protein and wants to diversify  -doesn't watch netflix or TV much; tried to figure out [employer] card and ebooks for about an hour with no success.  -taking zyprexa 2.5 mg at night; is not open to additional medication at this point.  -no si/hi  Review of Systems  Constitutional: Positive for weight loss. Negative for chills, fever and malaise/fatigue.  HENT: Negative for congestion and sore throat.   Respiratory: Negative for cough and shortness of breath.   Cardiovascular: Negative for chest pain and palpitations.  Gastrointestinal: Negative for abdominal pain, constipation, diarrhea, nausea and vomiting.  Genitourinary: Negative for dysuria and frequency.  Musculoskeletal: Negative for joint pain and myalgias.  Skin: Negative for rash.  Neurological: Negative for dizziness, tremors and headaches.  Psychiatric/Behavioral: Negative for suicidal ideas. The patient is nervous/anxious.      Patient Active Problem List   Diagnosis Date Noted  . Macrocytosis   . Bradycardia 08/14/2018  . Hypophosphatemia 08/09/2018  . Eating disorder 08/08/2018  . Moderate protein-calorie malnutrition ([place]) 08/08/2018   . Chronic bilateral low back pain without sciatica 02/05/2018    Current Outpatient Medications on File Prior to Visit  Medication Sig Dispense Refill  . feeding supplement, ENSURE ENLIVE, (ENSURE ENLIVE) LIQD Take 237 mLs by mouth as needed (if meal goal not met). 237 mL 12  . Multiple Vitamin (MULTIVITAMIN WITH MINERALS) TABS tablet Take 1 tablet by mouth daily. 30 tablet 0  . OLANZapine (ZYPREXA) 2.5 MG tablet Take 1 tablet (2.5 mg total) by mouth daily. 90 tablet 0   No current facility-administered medications on file prior to visit.     No Known Allergies  Physical Exam:    Vitals:   04/11/19 0938 04/11/19 0957  BP:  95/64  Pulse:  88  Weight: 94 lb 9.6 oz (42.9 kg)   Height: 5' 2.64" (1.591 m)    Wt Readings from Last 3 Encounters:  04/11/19 94 lb 9.6 oz (42.9 kg) (1 %, Z= -2.27)*  04/02/19 96 lb (43.5 kg) (2 %, Z= -2.12)*  03/31/19 90 lb 6.2 oz (41 kg) (<1 %, Z= -2.75)*   * Growth percentiles are based on CDC (Girls, 2-20 Years) data.    Growth Metrics: none included Median BMI for age: 35.5 BMI today: 17.01         % median today: 79% Goal rate of weight gain: 0.5-1.0/week  (from [employer], RD's note)    Blood pressure percentiles are not available for patients who are 18 years or older. No LMP recorded.  Physical Exam Vitals signs reviewed.  Constitutional:  Comments: Thin habitus Big sweatshirt, running shorts   HENT:     Head: Normocephalic.     Nose: No congestion.  Eyes:     Extraocular Movements: Extraocular movements intact.     Pupils: Pupils are equal, round, and reactive to light.  Neck:     Musculoskeletal: Normal range of motion.  Cardiovascular:     Rate and Rhythm: Normal rate and regular rhythm.     Heart sounds: No murmur.  Pulmonary:     Effort: Pulmonary effort is normal.  Abdominal:     General: Abdomen is flat.  Lymphadenopathy:     Cervical: No cervical adenopathy.  Skin:    General: Skin is dry.      Coloration: Skin is pale.  Neurological:     General: No focal deficit present.     Mental Status: She is oriented to person, place, and time.  Psychiatric:        Mood and Affect: Mood is anxious. Affect is tearful.     Assessment/Plan: 1. Anorexia nervosa, restricting type -discussed her weight loss today; reviewed that it is not uncommon to see loss in transition from hospital to home  -however, no more physical activity at home; did say that she can do yoga/stretching; but no running/walking -advised her that she should focus on other ways to keep her mind busy - suggested starting fluoxetine today but she was not open to this -reiterated that at this point I can not tell the camp that she is doing well in her treatment; need to see her gaining weight not losing  2. Moderate protein-calorie malnutrition ([place]) -as above; RD visit today  -she will ask Donetta about daily caloric count as she has been keeping to 2000 per day per her report to me; deferred to [facility].   3. Screening for genitourinary condition  - POCT Urinalysis Dipstick

## 2019-04-11 NOTE — Progress Notes (Signed)
Appointment start time: 2:58 Appointment end time: 3:55  Patient was seen on 04/11/2019 for nutrition counseling pertaining to disordered eating  Primary care provider: Dr. Willa Roughueben Therapist: Carollee HerterShannon at adolescent medicine  ROI: N/A Any other medical team members: adolescent Parents: none   Assessment  Pt states she was disappointed due to not being able to pass physical yet. Thinks she had unrealistic expectations. Reports she didn't gain 0.5 lb from previous visit.   Pt states she has not been exercising as much as usual-yoga a few times, rode bike for 15 min, and played on swings with friends. Reports she has increased water intake drinking about 60 oz/day. Pt does not report discomfort when eating.    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 Pt reports she has to pass her physical on 05/15.   Pt states she likes yoga, to run long distances, nanny, babysit, and cook. Pt states she used to swim in high school; year-round swimming.   Pt states she likes almond milk taste rather than regular milk taste. Will have Lactaid when having dairy products such as yogurt. Not a sweets person, prefers savory.   Planning to attend [employer] in the fall.    Growth Metrics: none included Median BMI for age: 8321.5 BMI today: 17.01 % median today:  79% Goal rate of weight gain:  0.5-1.0/week  Eating history: Length of time: Jan-Feb 2019 Previous treatments: [facility] Goals for RD meetings: improve menstrual period, cold tolerance, normalize food  Weight history:  Highest weight: 145   Lowest weight: 85 Most consistent weight: 105  What would you like to weigh: N/A How has weight changed in the past year: loss of 50 lbs  Medical Information:  Changes in hair, skin, nails since ED started: no Chewing/swallowing difficulties: no  Reflux or heartburn: no Trouble with teeth: no LMP without the use of hormones: Dec  2018  Weight at that point: 145  Effect of exercise on menses Decreased cycle  Constipation, diarrhea: yes constipation, when eating gluten 1-3 weeks. Has BM 1-2x/day  Dizziness/lightheadedness: no Headaches/body aches: no Heart racing/chest pain: no Mood: good Sleep: pretty good, sleeps 7-8 hrs/night Focus/concentration: good Cold intolerance: yes Vision changes: no  Mental health diagnosis: AN   Dietary assessment: A typical day consists of 3 meals and 3 snacks  Safe foods include: eggs, chicken, [place]key, ham, fish, seafood, roast beef, all vegetables, all fruit, sweet potatoes, butternut squash, yogurt, soy milk, almond milk, cheerios,  Avoided foods include: gluten, dairy  24 hour recall:  B: 4 eggs + 2 servings cheerios + 1/2 coffee creamer + Ensure + apple S: apple + banana L: chicken + butternut squash + salad + almond milk yogurt soymilk + 1/2 coffee + 1/2 avocado S: 1 egg + almond milk yogurt   D: chocolate almond milk + salad + 1/2 avocado + cheerios + tea + 20 deli Malaw[place]urkey  Beverage: coffee, tea, soy milk, almond milk, lemonade (occasionally), water (60 oz)   What Methods Do You Use To Control Your Weight (Compensatory behaviors)?           Restricting (calories, fat, carbs)  Exercise - running  Estimated energy intake: 2400-2600 kcal  Estimated energy needs: 2200 kcal 275 g CHO 110 g pro 73 g fat  Nutrition Diagnosis: NI-1.4 Inadequate energy intake As related to disordered eating.  As evidenced by weight loss and amenorrhea.  Intervention/Goals: Discussed with  pt the benefits of nourishing and restoring her body. Pt was educated and counseled on meal components and balancing meals with a variety of food groups. Pt is motivated and wants to get better. Pt was in agreement with goals listed.  Goals: - Have Ensure at least 2 times/day. Add Ensure as bedtime snack.  - Add peanut butter to morning snack.  - Fat sources include-Butter, margarine, peanut butter,  almond butter, cheese, cream cheese, full-fat salad dressing, mayonnaise, bacon, nuts, egg yolks, avocado, oils, coconut shavings - Continue to increase intake.   Meal plan:    3 meals    3 snacks  Monitoring and Evaluation: Patient will follow up in 1 weeks.

## 2019-04-11 NOTE — Patient Instructions (Addendum)
-   Have Ensure at least 2 times/day. Add Ensure as bedtime snack.   - Add peanut butter to morning snack.   - Fat sources include-Butter, margarine, peanut butter, almond butter, cheese, cream cheese, full-fat salad dressing, mayonnaise, bacon, nuts, egg yolks, avocado, oils, coconut shavings  - Continue to increase intake.

## 2019-04-17 ENCOUNTER — Ambulatory Visit (INDEPENDENT_AMBULATORY_CARE_PROVIDER_SITE_OTHER): Payer: Self-pay | Admitting: Licensed Clinical Social Worker

## 2019-04-17 ENCOUNTER — Telehealth: Payer: Self-pay | Admitting: Licensed Clinical Social Worker

## 2019-04-17 ENCOUNTER — Telehealth: Payer: Self-pay | Admitting: *Deleted

## 2019-04-17 DIAGNOSIS — F4329 Adjustment disorder with other symptoms: Secondary | ICD-10-CM

## 2019-04-17 NOTE — Telephone Encounter (Signed)
Called patient to do pre-screening  and no answer, mailbox is full could not leave a message.

## 2019-04-17 NOTE — BH Specialist Note (Signed)
Integrated Behavioral Health via Telemedicine Video Visit  04/17/2019 ADALAIDE JASKOLSKI [id number]  Number of [place] visits: 3 Session Start time: 11:17 AM   Session End time: 11:41 AM Total time: 24 minutes  Referring Provider: Hoyt Koch, NP Type of Visit: Video Patient/Family location: Home [facility] Provider location: Remote home office All persons participating in visit: Patient and [facility]  Confirmed patient's address: Yes  Confirmed patient's phone number: Yes  Any changes to demographics: No   Confirmed patient's insurance: Yes  Any changes to patient's insurance: No   Discussed confidentiality: Yes   I connected with LINDZIE BOXX by a video enabled telemedicine application and verified that I am speaking with the correct person using two identifiers.     I discussed the limitations of evaluation and management by telemedicine and the availability of in person appointments.  I discussed that the purpose of this visit is to provide behavioral health care while limiting exposure to the novel coronavirus.   Discussed there is a possibility of technology failure and discussed alternative modes of communication if that failure occurs.  I discussed that engaging in this video visit, they consent to the provision of behavioral healthcare and the services will be billed under their insurance.  Patient and/or legal guardian expressed understanding and consented to video visit: Yes   PRESENTING CONCERNS: Patient and/or family reports the following symptoms/concerns: Proud of self for meeting goals, anxious about physical upcoming. Duration of problem: Ongoing; Severity of problem: moderate  STRENGTHS (Protective Factors/Coping Skills): Willing to participate, motivated  GOALS ADDRESSED: Patient will: 1.  Reduce symptoms of: stress  2.  Increase knowledge and/or ability of: coping skills  3.  Demonstrate ability to: Increase healthy adjustment to current  life circumstances  INTERVENTIONS: Interventions utilized:  Solution-Focused Strategies, Supportive Counseling and Psychoeducation and/or Health Education Standardized Assessments completed: Not Needed  ASSESSMENT: Patient currently experiencing worry about upcoming physical, wants to pass very badly.   Patient may benefit from self-care, evaluation of positive performance.  Boundaries for next time. -Roommate situations.  Goals: Met all goals!   PLAN: 1. Follow up with behavioral health clinician on : 04/24/2019 2. Behavioral recommendations: Continue with goal setting and self-praise. 3. Referral(s): [facility] (In Clinic)  I discussed the assessment and treatment plan with the patient and/or parent/guardian. They were provided an opportunity to ask questions and all were answered. They agreed with the plan and demonstrated an understanding of the instructions.   They were advised to call back or seek an in-person evaluation if the symptoms worsen or if the condition fails to improve as anticipated.  [name]

## 2019-04-17 NOTE — Telephone Encounter (Signed)
Pre-screening for in-office visit  1. Who is bringing the patient to the visit? Only patient  Informed only one adult can bring patient to the visit to limit possible exposure to COVID19. And if they have a face mask to wear it.   2. Has the person bringing the patient or the patient traveled outside of the state in the past 14 days? no   3. Has the person bringing the patient or the patient had contact with anyone with suspected or confirmed COVID-19 in the last 14 days? no   4. Has the person bringing the patient or the patient had any of these symptoms in the last 14 days? no   Fever (temp 100.4 F or higher) Difficulty breathing Cough  If all answers are negative, advise patient to call our office prior to your appointment if you or the patient develop any of the symptoms listed above.   If any answers are yes, cancel in-office visit and schedule the patient for a same day telehealth visit with a provider to discuss the next steps.

## 2019-04-18 ENCOUNTER — Encounter: Payer: Self-pay | Admitting: Registered"

## 2019-04-18 ENCOUNTER — Other Ambulatory Visit: Payer: Self-pay

## 2019-04-18 ENCOUNTER — Encounter: Payer: Self-pay | Admitting: Family

## 2019-04-18 ENCOUNTER — Encounter: Payer: No Typology Code available for payment source | Admitting: Registered"

## 2019-04-18 ENCOUNTER — Ambulatory Visit (INDEPENDENT_AMBULATORY_CARE_PROVIDER_SITE_OTHER): Payer: Self-pay | Admitting: Family

## 2019-04-18 VITALS — BP 102/58 | HR 66 | Ht 62.6 in | Wt 99.0 lb

## 2019-04-18 DIAGNOSIS — F5001 Anorexia nervosa, restricting type: Secondary | ICD-10-CM | POA: Diagnosis not present

## 2019-04-18 DIAGNOSIS — Z1389 Encounter for screening for other disorder: Secondary | ICD-10-CM

## 2019-04-18 DIAGNOSIS — E440 Moderate protein-calorie malnutrition: Secondary | ICD-10-CM

## 2019-04-18 LAB — POCT URINALYSIS DIPSTICK
Bilirubin, UA: NEGATIVE
Blood, UA: NEGATIVE
Glucose, UA: NEGATIVE
Ketones, UA: NEGATIVE
Leukocytes, UA: NEGATIVE
Nitrite, UA: NEGATIVE
Protein, UA: NEGATIVE
Spec Grav, UA: <=1.005 — AB (ref 1.010–1.025)
Urobilinogen, UA: NEGATIVE E.U./dL — AB
pH, UA: >=9 — AB (ref 5.0–8.0)

## 2019-04-18 NOTE — Progress Notes (Signed)
Appointment start time: 8:00 Appointment end time: 8:50  Patient was seen on 04/18/2019 for nutrition counseling pertaining to disordered eating  Primary care provider: Dr. Willa Roughueben Therapist: Carollee HerterShannon at adolescent medicine  ROI: N/A Any other medical team members: adolescent Parents: none   Assessment  Pt states she has had some stomach discomfort sometimes when eating. Sometimes will have to stop eating and return to meal an hours later to complete. No longer discarding food. Reports consistently having 2 Ensure/day and sometimes adding a 3rd one. No physical activity since previous visit other than yoga. Has added more coffee creamer to coffee to help increase intake. Pt reports challenging eating disorder voice telling her to wait until the next day to eat when hungry. Pt states she is ready to change and in the past she was not willing. Pt has increased intake.   Pt states camp starts 06/27; plans to leave around 06/24. 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   Reports she had a good time with mom over the weekend.    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 Pt reports she has to pass her physical on 05/15. Pt states she likes yoga, to run long distances, nanny, babysit, and cook. Pt states she used to swim in high school; year-round swimming. Pt states she likes almond milk taste rather than regular milk taste. Will have Lactaid when having dairy products such as yogurt. Not a sweets person, prefers savory. Planning to attend [employer] in the fall.    Growth Metrics: none included Median BMI for age: 6721.5 BMI today: 17.01 % median today:  79% Goal rate of weight gain:  0.5-1.0/week  Eating history: Length of time: Jan-Feb 2019 [facility] Goals for RD meetings: improve menstrual period, cold tolerance, normalize food  Weight history:  Highest weight: 145   Lowest weight:  85 Most consistent weight: 105  What would you like to weigh: N/A How has weight changed in the past year: loss of 50 lbs  Medical Information:  Changes in hair, skin, nails since ED started: no Chewing/swallowing difficulties: no  Reflux or heartburn: no Trouble with teeth: no LMP without the use of hormones: Dec 2018, some spotting since 05/06    Weight at that point: 145  Effect of exercise on menses Decreased cycle  Constipation, diarrhea: has not had BM since yesterday, feesl weird; yes constipation, when eating gluten 1-3 weeks. Has BM 1-2x/day  Dizziness/lightheadedness: no Headaches/body aches: no Heart racing/chest pain: no Mood: good Sleep: pretty good, sleeps 7-8 hrs/night Focus/concentration: good Cold intolerance: yes, improving Vision changes: no  Mental health diagnosis: AN   Dietary assessment: A typical day consists of 3 meals and 3 snacks  Safe foods include: eggs, chicken, [place]key, ham, fish, seafood, roast beef, all vegetables, all fruit, sweet potatoes, butternut squash, yogurt, soy milk, almond milk, cheerios,  Avoided foods include: gluten, dairy  24 hour recall:  B: 4 eggs + 2 waffles + 1/2 coffee creamer + Ensure + apple (884) S: apple + banana + peanut butter (individual) (432) L: Ensure (sometimes) + chicken (with olive oil) + carrots + 2 sweet potatoes (1 with coconut shavings, 1 without) 1/2 coffee  S: 2 egg + cheese  D: ham + avocado + salad + chocolate almond milk + butternut squash  S: Ensure  Beverage: coffee, chocolate almond milk, water (~80 oz)  What Methods Do You Use  To Control Your Weight (Compensatory behaviors)?           Restricting (calories, fat, carbs)  Exercise - running  Estimated energy intake: 2600-2800 kcal  Estimated energy needs: 2200 kcal 275 g CHO 110 g pro 73 g fat  Nutrition Diagnosis: NI-1.4 Inadequate energy intake As related to disordered eating.  As evidenced by weight loss and  amenorrhea.  Intervention/Goals: Discussed with pt the benefits of nourishing and restoring her body. Pt was educated and counseled on meal components and balancing meals with a variety of food groups. Pt is motivated and wants to get better. Pt has increased intake. Pt was in agreement with goals listed.  Goals: - Aim to have 3 Ensure daily and keep up the great work being intentional about nourishing your body.  - Check out [email or web address]  on instagram  Meal plan:    3 meals    3 snacks  Monitoring and Evaluation: Patient will follow up in 1 weeks.

## 2019-04-18 NOTE — Patient Instructions (Addendum)
-   Aim to have 3 Ensure daily and keep up the great work being intentional about nourishing your body.   - Check out [email or web address]  on instagram

## 2019-04-18 NOTE — Progress Notes (Signed)
History was provided by the patient.  Angela [name] is a 18 y.o. female who is here for Anorexia Nervosa, restricting type.   PCP confirmed? Yes.    Truddie Coco, MD  HPI:   -has been working hard this week to meet the meal plan  -stopped all exercise minus yoga  -stooled yesterday, small  -taking medicine as prescribed -has been working really hard; one day she ate more than her meal plan and just listened to hunger cues -no si/hi  Review of Systems  Constitutional: Negative for chills, fever, malaise/fatigue and weight loss.  HENT: Negative for congestion and sore throat.   Eyes: Negative for blurred vision and pain.  Respiratory: Negative for cough and shortness of breath.   Cardiovascular: Negative for chest pain and palpitations.  Gastrointestinal: Negative for abdominal pain, constipation, diarrhea, heartburn and nausea.  Genitourinary: Negative for dysuria and urgency.  Musculoskeletal: Negative for myalgias.  Skin: Negative for rash.  Neurological: Negative for dizziness and headaches.  Psychiatric/Behavioral: Negative for depression. The patient is nervous/anxious.       Patient Active Problem List   Diagnosis Date Noted  . Macrocytosis   . Bradycardia 08/14/2018  . Hypophosphatemia 08/09/2018  . Eating disorder 08/08/2018  . Moderate protein-calorie malnutrition ([place]) 08/08/2018  . Chronic bilateral low back pain without sciatica 02/05/2018    Current Outpatient Medications on File Prior to Visit  Medication Sig Dispense Refill  . feeding supplement, ENSURE ENLIVE, (ENSURE ENLIVE) LIQD Take 237 mLs by mouth as needed (if meal goal not met). 237 mL 12  . Multiple Vitamin (MULTIVITAMIN WITH MINERALS) TABS tablet Take 1 tablet by mouth daily. 30 tablet 0  . OLANZapine (ZYPREXA) 2.5 MG tablet Take 1 tablet (2.5 mg total) by mouth daily. 90 tablet 0   No current facility-administered medications on file prior to visit.     No Known Allergies  Physical Exam:     Vitals:   04/18/19 0938  BP: (!) 102/58  Pulse: 66  Weight: 99 lb (44.9 kg)  Height: 5' 2.6" (1.59 m)   Wt Readings from Last 3 Encounters:  04/18/19 99 lb (44.9 kg) (3 %, Z= -1.83)*  04/11/19 94 lb 9.6 oz (42.9 kg) (1 %, Z= -2.27)*  04/02/19 96 lb (43.5 kg) (2 %, Z= -2.12)*   * Growth percentiles are based on CDC (Girls, 2-20 Years) data.   BP Readings from Last 3 Encounters:  04/18/19 (!) 102/58  04/11/19 95/64  04/02/19 100/60     Blood pressure percentiles are not available for patients who are 18 years or older. No LMP recorded.  Physical Exam HENT:     Head: Normocephalic.  Eyes:     Extraocular Movements: Extraocular movements intact.     Pupils: Pupils are equal, round, and reactive to light.  Neck:     Musculoskeletal: Normal range of motion.  Cardiovascular:     Rate and Rhythm: Normal rate and regular rhythm.     Heart sounds: No murmur.  Pulmonary:     Effort: Pulmonary effort is normal.  Abdominal:     General: Abdomen is flat.  Musculoskeletal: Normal range of motion.  Lymphadenopathy:     Cervical: No cervical adenopathy.  Skin:    General: Skin is warm and dry.     Capillary Refill: Capillary refill takes less than 2 seconds.     Coloration: Skin is not pale.     Findings: No rash.  Neurological:     General: No focal deficit present.  Mental Status: She is oriented to person, place, and time.  Psychiatric:        Mood and Affect: Mood is anxious.     Comments: Tearful and engaging, good eye contact      Assessment/Plan: 1. Anorexia nervosa, restricting type -up 5 lbs since last OV  -continue with current tx plan and meal plan  -praise given for work done  -no walking, only yoga  -follow-up in one week  -will review camp medical form and advise them of her current condition   2. Screening for genitourinary condition -reviewed - POCT Urinalysis Dipstick  3. Moderate protein-calorie malnutrition ([place]) -continue with meal plan  per Ellin Mayhew RD

## 2019-04-19 ENCOUNTER — Encounter: Payer: Self-pay | Admitting: Family

## 2019-04-24 ENCOUNTER — Ambulatory Visit (INDEPENDENT_AMBULATORY_CARE_PROVIDER_SITE_OTHER): Payer: Self-pay | Admitting: Licensed Clinical Social Worker

## 2019-04-24 ENCOUNTER — Telehealth: Payer: Self-pay

## 2019-04-24 DIAGNOSIS — F4329 Adjustment disorder with other symptoms: Secondary | ICD-10-CM

## 2019-04-24 NOTE — Telephone Encounter (Signed)
Pre-screening for in-office visit  1. Who is bringing the patient to the visit?  BRINGING SELF  Informed only one adult can bring patient to the visit to limit possible exposure to COVID19. And if they have a face mask to wear it.   2. Has the person bringing the patient or the patient traveled outside of the state in the past 14 days?   NO 3. Has the person bringing the patient or the patient had contact with anyone with suspected or confirmed COVID-19 in the last 14 days? NO 4. Has the person bringing the patient or the patient had any of these symptoms in the last 14 days?  NO Fever (temp 100.4 F or higher) Difficulty breathing Cough  If all answers are negative, advise patient to call our office prior to your appointment if you or the patient develop any of the symptoms listed above.  PT ADVISED If any answers are yes, cancel in-office visit and schedule the patient for a same day telehealth visit with a provider to discuss the next steps. 

## 2019-04-24 NOTE — BH Specialist Note (Signed)
Integrated Behavioral Health via Telemedicine Video Visit  04/24/2019 Angela [name] [id number]  Number of Integrated Behavioral Health visits: 4 Session Start time: 11:00 AM Session End time: 11:56 AM  Total time: 55 minutes  Referring Provider: [name], NP Type of Visit: Video Patient/Family location: Home  [facility] Provider location: Remote home office All persons participating in visit: Patient and [facility]  Confirmed patient's address: Yes  Confirmed patient's phone number: Yes  Any changes to demographics: No   Confirmed patient's insurance: Yes  Any changes to patient's insurance: No   Discussed confidentiality: Yes   I connected with Angela [name]'s patient by a video enabled telemedicine application and verified that I am speaking with the correct person using two identifiers.     I discussed the limitations of evaluation and management by telemedicine and the availability of in person appointments.  I discussed that the purpose of this visit is to provide behavioral health care while limiting exposure to the novel coronavirus.   Discussed there is a possibility of technology failure and discussed alternative modes of communication if that failure occurs.  I discussed that engaging in this video visit, they consent to the provision of behavioral healthcare and the services will be billed under their insurance.  Patient and/or legal guardian expressed understanding and consented to video visit: Yes   PRESENTING CONCERNS: Patient and/or family reports the following symptoms/concerns: Camp cancelled, sad and mad - still meeting goals. Wanted to go for a run, but didn't. Duration of problem: Ongoing; Severity of problem: moderate  STRENGTHS (Protective Factors/Coping Skills): Willing, engaged  GOALS ADDRESSED: Patient will: 1.  Reduce symptoms of: stress  2.  Increase knowledge and/or ability of: coping skills, healthy habits and self-management skills  3.   Demonstrate ability to: Increase healthy adjustment to current life circumstances  INTERVENTIONS: Interventions utilized:  Solution-Focused Strategies, Brief CBT, Supportive Counseling and Psychoeducation and/or Health Education Standardized Assessments completed: Not Needed  ASSESSMENT: Patient currently experiencing stress, loss of anticipated summer activity.   Patient may benefit from healthy boundaries, healthy communication.  Practice mindful meditation for 1 minute each day for next week.  PLAN: 1. Follow up with behavioral health clinician on : 5/26 2. Behavioral recommendations: See above 3. Referral(s): Integrated [employer] (In Clinic)  I discussed the assessment and treatment plan with the patient and/or parent/guardian. They were provided an opportunity to ask questions and all were answered. They agreed with the plan and demonstrated an understanding of the instructions.   They were advised to call back or seek an in-person evaluation if the symptoms worsen or if the condition fails to improve as anticipated.  Angela [name]

## 2019-04-25 ENCOUNTER — Ambulatory Visit (INDEPENDENT_AMBULATORY_CARE_PROVIDER_SITE_OTHER): Payer: Self-pay | Admitting: Family

## 2019-04-25 ENCOUNTER — Encounter: Payer: No Typology Code available for payment source | Admitting: Registered"

## 2019-04-25 ENCOUNTER — Other Ambulatory Visit: Payer: Self-pay

## 2019-04-25 ENCOUNTER — Encounter: Payer: Self-pay | Admitting: Family

## 2019-04-25 ENCOUNTER — Encounter: Payer: Self-pay | Admitting: Registered"

## 2019-04-25 VITALS — BP 94/60 | HR 73 | Ht 62.6 in | Wt 100.0 lb

## 2019-04-25 DIAGNOSIS — Z1389 Encounter for screening for other disorder: Secondary | ICD-10-CM

## 2019-04-25 DIAGNOSIS — F5001 Anorexia nervosa, restricting type: Secondary | ICD-10-CM

## 2019-04-25 DIAGNOSIS — F5000 Anorexia nervosa, unspecified: Secondary | ICD-10-CM

## 2019-04-25 LAB — POCT URINALYSIS DIPSTICK
Bilirubin, UA: NEGATIVE
Blood, UA: NEGATIVE
Glucose, UA: NEGATIVE
Ketones, UA: NEGATIVE
Leukocytes, UA: NEGATIVE
Nitrite, UA: NEGATIVE
Protein, UA: NEGATIVE
Spec Grav, UA: 1.01 (ref 1.010–1.025)
Urobilinogen, UA: 0.2 E.U./dL
pH, UA: 8 (ref 5.0–8.0)

## 2019-04-25 NOTE — Patient Instructions (Addendum)
-   Keep up the great work! You are doing amazing! 

## 2019-04-25 NOTE — Progress Notes (Signed)
History was provided by the patient.   Angela [name] is a 18 y.o. female who is here for follow up on anorexia nervosa, restricting type.    PCP confirmed? Roderic Scarce, MD   HPI:   - Found out camp is canceled  - Has applied to multiple new camps, and is hopeful to be interviewing with one today  - Taking medication as prescribed  - Feels things are going well overall  - Knows getting her period will be a good sign and is something we are looking for, however is unsure how we will measure this since she has "never had a normal period"  - First period at 18yo, was light, did not have another period for 2-2.5 years - Had about 4 total periods after that, very irregular, then stopped having periods  - States her mom had difficulty with irregular periods when she was younger  - Has never had workup  - Denies sexual activity   Review of Systems  Constitutional: Negative for chills, fever, malaise/fatigue and weight loss.  HENT: Negative for sore throat.   Respiratory: Negative for cough and shortness of breath.   Cardiovascular: Negative for chest pain and palpitations.  Gastrointestinal: Negative for abdominal pain, constipation, nausea and vomiting.  Genitourinary: Negative for dysuria and frequency.  Musculoskeletal: Negative for myalgias.  Skin: Positive for rash.       R upper thigh, wart she wants removed  Neurological: Negative for dizziness.  Psychiatric/Behavioral: Negative for depression, substance abuse and suicidal ideas.     Patient Active Problem List   Diagnosis Date Noted  . Macrocytosis   . Bradycardia 08/14/2018  . Hypophosphatemia 08/09/2018  . Eating disorder 08/08/2018  . Moderate protein-calorie malnutrition ([place]) 08/08/2018  . Chronic bilateral low back pain without sciatica 02/05/2018    Current Outpatient Medications on File Prior to Visit  Medication Sig Dispense Refill  . feeding supplement, ENSURE ENLIVE, (ENSURE ENLIVE) LIQD Take 237 mLs by  mouth as needed (if meal goal not met). 237 mL 12  . Multiple Vitamin (MULTIVITAMIN WITH MINERALS) TABS tablet Take 1 tablet by mouth daily. 30 tablet 0  . OLANZapine (ZYPREXA) 2.5 MG tablet Take 1 tablet (2.5 mg total) by mouth daily. 90 tablet 0   No current facility-administered medications on file prior to visit.     No Known Allergies  Physical Exam:    Vitals:   04/25/19 0938 04/25/19 0950 04/25/19 0951  BP: (!) 90/43 (!) 94/58 94/60  Pulse: 61 (!) 51 73  Weight: 100 lb (45.4 kg)    Height: 5' 2.6" (1.59 m)      Blood pressure percentiles are not available for patients who are 18 years or older. No LMP recorded.  Physical Exam Constitutional:      Appearance: She is not ill-appearing.  HENT:     Head: Normocephalic.     Mouth/Throat:     Mouth: Mucous membranes are moist.  Eyes:     Extraocular Movements: Extraocular movements intact.     Pupils: Pupils are equal, round, and reactive to light.  Neck:     Musculoskeletal: Normal range of motion.  Cardiovascular:     Rate and Rhythm: Normal rate and regular rhythm.     Heart sounds: No murmur.  Pulmonary:     Effort: Pulmonary effort is normal.  Abdominal:     General: Abdomen is flat.  Musculoskeletal: Normal range of motion.        General: No swelling.  Skin:    General: Skin is warm and dry.     Capillary Refill: Capillary refill takes less than 2 seconds.     Findings: Lesion (R lateral thigh 0.5 cm raised lesion consistent with wart) present.  Neurological:     General: No focal deficit present.     Mental Status: She is alert and oriented to person, place, and time.  Psychiatric:        Mood and Affect: Mood normal.     General: appears well nourished, well developed, and in no acute distress  HEENT: normocephalic and atraumatic. Nares patent and clear. Moist mucous membranes.  Neck: Supple, no lymphadenopathy Respiratory: Normal WOB, no retractions nasal flaring or grunting. Normal and equal air  movement bilaterally, no wheezes or crackles.  CV: Normal rate, regular rhythm. No murmurs rubs clicks or gallops appreciated.  Abdominal: Soft, nontender, nondistended. No hepatosplenomegaly appreciated.  Extremities: Warm and well perfused  Neuro: Grossly normal, pt is alert, moving all extremities  Skin: No rashes, bruising, jaundice, or mottling noted.   Assessment/Plan:  1. Anorexia nervosa, restricting type -up 1 lb since last OV  -continue with current tx plan and meal plan  -praise given for work done  -okay to walk in moderation  -follow-up in one week  -will review camp medical form if she will be working there   2. Irregular periods  - Will plan for workup at next visit   3. Moderate protein-calorie malnutrition ([place]) -continue with meal plan per D Mancos. Perfecto Purdy MD 04/25/19   10:40 AM

## 2019-04-25 NOTE — Progress Notes (Signed)
Appointment start time: 8:00 Appointment end time: 8:37  Patient was seen on 04/25/2019 for nutrition counseling pertaining to disordered eating  Primary care provider: Dr. Willa Rough Therapist: Carollee Herter at adolescent medicine  ROI: N/A Any other medical team members: adolescent Parents: none   Assessment  Pt arrives stating she felt bloated last week. Felt she was hungry at times but too full to eat. Reports daily bowel movements. Pt states she is disappointed that camp was canceled but applying to another one as well. Reports her motivation is the ability to use her treadmill again and willing to earn this privilege. States she has plans on how to increase intake when able to add physical activity into her regimen such as smoothies that include: peanut butter/almond butter, soy milk, cottage cheese, fruits and vegetables. Also, has added in 3rd Ensure and salad dressing to salads.   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 Pt states she used to swim in high school; year-round swimming. Pt states she likes almond milk taste rather than regular milk taste. Will have Lactaid when having dairy products such as yogurt. Not a sweets person, prefers savory. Planning to attend [employer] in the fall.    Growth Metrics: none included Median BMI for age: 41.5 BMI today: 17.01 % median today:  79% Goal rate of weight gain:  0.5-1.0/week  Eating history: Length of time: Jan-Feb 2019 Previous treatments: [facility] at [facility] for 3 days Goals for RD meetings: improve menstrual period, cold tolerance, normalize food  Weight history:  Highest weight: 145   Lowest weight: 85 Most consistent weight: 105  What would you like to weigh: N/A How has weight changed in the past year: loss of 50 lbs  Medical Information:  Changes in hair, skin, nails since ED started: no Chewing/swallowing difficulties: no  Reflux or heartburn: no Trouble with teeth:  no LMP without the use of hormones: Dec 2018, some spotting since 05/06    Weight at that point: 145  Effect of exercise on menses Decreased cycle  Constipation, diarrhea: has not had BM since yesterday, feesl weird; yes constipation, when eating gluten 1-3 weeks. Has BM once/day  Dizziness/lightheadedness: no Headaches/body aches: no Heart racing/chest pain: no Mood: good Sleep: pretty good, sleeps 7-8 hrs/night Focus/concentration: good Cold intolerance: yes, improving Vision changes: no  Mental health diagnosis: AN   Dietary assessment: A typical day consists of 3 meals and 3 snacks  Safe foods include: eggs, chicken, [place], ham, fish, seafood, roast beef, all vegetables, all fruit, sweet potatoes, butternut squash, yogurt, soy milk, almond milk, cheerios,  Avoided foods include: gluten, dairy  24 hour recall:  B: 3 eggs + 1 bowl cheerios with coconut shavings + 1 c coffee creamer + Ensure + apple + water  S: apple + banana + peanut butter (individual)  L: Ensure + acorn squash + salad honey dijon dressing + fruit + chicken + 1 c coffee + water S: Ensure D: 4 eggs + 2 waffles + blueberries + tea + soy milk + salad with salad dressing + sweet potatoes as topping  S: soymilk  Beverage: coffee, chocolate almond milk, water   Physical activity: yoga  What Methods Do You Use To Control Your Weight (Compensatory behaviors)?           Restricting (calories, fat, carbs)  Exercise - running  Estimated energy intake: ~3000 kcal  Estimated energy needs: 2400 kcal 300 g CHO 120 g pro 80 g  fat  Nutrition Diagnosis: NI-1.4 Inadequate energy intake As related to disordered eating.  As evidenced by weight loss and amenorrhea.  Intervention/Goals: Discussed with pt the benefits of nourishing and restoring her body. Pt was educated and counseled on meal components and balancing meals with a variety of food groups. Pt is motivated and wants to get better. Pt has increased intake.  Pt was in agreement with goals listed.  Goals: - Keep up the great work! You are doing amazing!  Meal plan:    3 meals    3 snacks  Monitoring and Evaluation: Patient will follow up in 1 weeks.

## 2019-04-29 NOTE — Progress Notes (Signed)
Attending Co-Signature.  I am the supervising provider and available for consultation as needed for the nurse practitioner who assisted the resident with the assessment and management plan as documented.     Leslee Haueter F Aracelly Tencza, MD Adolescent Medicine Specialist   

## 2019-05-01 ENCOUNTER — Ambulatory Visit (INDEPENDENT_AMBULATORY_CARE_PROVIDER_SITE_OTHER): Payer: Self-pay | Admitting: Licensed Clinical Social Worker

## 2019-05-01 DIAGNOSIS — F4329 Adjustment disorder with other symptoms: Secondary | ICD-10-CM

## 2019-05-01 NOTE — BH Specialist Note (Signed)
Integrated Behavioral Health via Telemedicine Video Visit  05/01/2019 CHARLEN NEMEROFF [id number]  Number of Integrated Behavioral Health visits: 5 Session Start time: 10A  Session End time: 10:20 AM  Total time: 20 minutes  Referring Provider: [name], NP Type of Visit: Video Patient/Family location: at home [facility] Provider location: remote home office All persons participating in visit: Patient and [facility]  Confirmed patient's address: Yes  Confirmed patient's phone number: Yes  Any changes to demographics: No   Confirmed patient's insurance: Yes  Any changes to patient's insurance: No   Discussed confidentiality: Yes   I connected with Keyarah Grunwald [place]'s patient by a video enabled telemedicine application and verified that I am speaking with the correct person using two identifiers.     I discussed the limitations of evaluation and management by telemedicine and the availability of in person appointments.  I discussed that the purpose of this visit is to provide behavioral health care while limiting exposure to the novel coronavirus.   Discussed there is a possibility of technology failure and discussed alternative modes of communication if that failure occurs.  I discussed that engaging in this video visit, they consent to the provision of behavioral healthcare and the services will be billed under their insurance.  Patient and/or legal guardian expressed understanding and consented to video visit: Yes   PRESENTING CONCERNS: Patient and/or family reports the following symptoms/concerns: New camp interview today, got permission to exercise some from [place] Duration of problem: Years; Severity of problem: moderate  STRENGTHS (Protective Factors/Coping Skills): Willing ot participate  GOALS ADDRESSED: Patient will: 1.  Reduce symptoms of: disordered eating  2.  Increase knowledge and/or ability of: coping skills, healthy habits and self-management skills  3.   Demonstrate ability to: Increase healthy adjustment to current life circumstances and Increase adequate support systems for patient/family  INTERVENTIONS: Interventions utilized:  Motivational Interviewing, Solution-Focused Strategies, Supportive Counseling and Psychoeducation and/or Health Education Standardized Assessments completed: Not Needed  ASSESSMENT: Patient currently experiencing excitement about upcoming interview, hopeful for being hired at new camp.   Patient may benefit from continued healthy habits and self-care.  PLAN: 1. Follow up with behavioral health clinician on : 6/2 2. Behavioral recommendations: Ensure, continue to find proud moments 3. Referral(s): Integrated [employer] (In Clinic)  I discussed the assessment and treatment plan with the patient and/or parent/guardian. They were provided an opportunity to ask questions and all were answered. They agreed with the plan and demonstrated an understanding of the instructions.   They were advised to call back or seek an in-person evaluation if the symptoms worsen or if the condition fails to improve as anticipated.  [name]

## 2019-05-02 ENCOUNTER — Encounter: Payer: Self-pay | Admitting: Family

## 2019-05-02 ENCOUNTER — Ambulatory Visit (INDEPENDENT_AMBULATORY_CARE_PROVIDER_SITE_OTHER): Payer: Self-pay | Admitting: Family

## 2019-05-02 ENCOUNTER — Other Ambulatory Visit: Payer: Self-pay

## 2019-05-02 VITALS — BP 106/64 | HR 67 | Ht 62.84 in | Wt 100.0 lb

## 2019-05-02 DIAGNOSIS — B079 Viral wart, unspecified: Secondary | ICD-10-CM

## 2019-05-02 DIAGNOSIS — F5001 Anorexia nervosa, restricting type: Secondary | ICD-10-CM

## 2019-05-02 DIAGNOSIS — N911 Secondary amenorrhea: Secondary | ICD-10-CM

## 2019-05-02 DIAGNOSIS — Z1389 Encounter for screening for other disorder: Secondary | ICD-10-CM

## 2019-05-02 LAB — POCT URINALYSIS DIPSTICK
Bilirubin, UA: NEGATIVE
Blood, UA: NEGATIVE
Glucose, UA: NEGATIVE
Ketones, UA: NEGATIVE
Leukocytes, UA: NEGATIVE
Nitrite, UA: NEGATIVE
Protein, UA: NEGATIVE
Spec Grav, UA: <=1.005 — AB (ref 1.010–1.025)
Urobilinogen, UA: NEGATIVE E.U./dL — AB
pH, UA: 7 (ref 5.0–8.0)

## 2019-05-02 NOTE — Progress Notes (Signed)
taf 

## 2019-05-02 NOTE — Progress Notes (Signed)
History was provided by the patient.  Angela [name] is a 18 y.o. female who is here for Anorexia Nervosa, restricting type.   PCP confirmed? Yes.    Default, Provider, MD  HPI:   -she had a good week, went to [employer] with her mom  -they took their bikes and exercised; she was worried that she had overdone it and didn't want to lose weight this week  -she drank 4 ENSUREs  -she endorses having more energy and really feeling good -she hasn't had her period yet and wants to do the labs today to see what's going on  -waiting to hear back from a [employer] camp re: swim coach  Review of Systems  Constitutional: Negative for chills, fever and weight loss.  HENT: Negative for sore throat.   Respiratory: Negative for cough and shortness of breath.   Cardiovascular: Negative for chest pain and palpitations.  Gastrointestinal: Negative for abdominal pain, diarrhea, nausea and vomiting.  Genitourinary: Negative for dysuria.  Musculoskeletal: Negative for myalgias.  Skin: Negative for rash.  Neurological: Negative for dizziness and headaches.  Psychiatric/Behavioral: Negative for suicidal ideas. The patient is not nervous/anxious.       Patient Active Problem List   Diagnosis Date Noted  . Macrocytosis   . Bradycardia 08/14/2018  . Hypophosphatemia 08/09/2018  . Eating disorder 08/08/2018  . Moderate protein-calorie malnutrition ([place]) 08/08/2018  . Chronic bilateral low back pain without sciatica 02/05/2018    Current Outpatient Medications on File Prior to Visit  Medication Sig Dispense Refill  . feeding supplement, ENSURE ENLIVE, (ENSURE ENLIVE) LIQD Take 237 mLs by mouth as needed (if meal goal not met). 237 mL 12  . Multiple Vitamin (MULTIVITAMIN WITH MINERALS) TABS tablet Take 1 tablet by mouth daily. 30 tablet 0  . OLANZapine (ZYPREXA) 2.5 MG tablet Take 1 tablet (2.5 mg total) by mouth daily. 90 tablet 0   No current facility-administered medications on file prior to visit.      No Known Allergies  Physical Exam:    Vitals:   05/02/19 0947  BP: 106/64  Pulse: 67  Weight: 100 lb (45.4 kg)  Height: 5' 2.84" (1.596 m)   Wt Readings from Last 3 Encounters:  05/02/19 100 lb (45.4 kg) (4 %, Z= -1.74)*  04/25/19 100 lb (45.4 kg) (4 %, Z= -1.74)*  04/18/19 99 lb (44.9 kg) (3 %, Z= -1.83)*   * Growth percentiles are based on CDC (Girls, 2-20 Years) data.    Blood pressure percentiles are not available for patients who are 18 years or older. No LMP recorded.  Physical Exam Constitutional:      Appearance: She is not ill-appearing or toxic-appearing.  HENT:     Head: Normocephalic.     Mouth/Throat:     Mouth: Mucous membranes are moist.     Pharynx: No oropharyngeal exudate.  Eyes:     General: No scleral icterus.    Extraocular Movements: Extraocular movements intact.     Pupils: Pupils are equal, round, and reactive to light.  Neck:     Musculoskeletal: Normal range of motion.  Cardiovascular:     Rate and Rhythm: Normal rate and regular rhythm.     Heart sounds: No murmur.  Pulmonary:     Effort: Pulmonary effort is normal.  Musculoskeletal: Normal range of motion.        General: No swelling.  Lymphadenopathy:     Cervical: No cervical adenopathy.  Skin:    General: Skin is warm.  Coloration: Skin is not pale.     Findings: Lesion (R lateral thigh 0.5 cm depth, 1.0 cm width) present.  Neurological:     General: No focal deficit present.     Mental Status: She is alert and oriented to person, place, and time.  Psychiatric:        Mood and Affect: Mood normal.     Assessment/Plan: 1. Anorexia nervosa, restricting type -she is stable since addition of exercise; praise given for maintaining her weight -acknowledged that she did not gain this week however, she was able to increase physical activity and maintain same weight as last year -continue with zyprexa 2.5 mg -continue with therapy and RD appointments  2. Secondary  amenorrhea -will assess for any other etiologies for amenorrhea outside of malnutrition, anorexia nervosa, restricting type  - DHEA-sulfate - Follicle stimulating hormone - Luteinizing hormone - Prolactin - Testos,Total,Free and SHBG (Female) - TSH + free T4 - 17-Hydroxyprogesterone - Androstenedione - Estradiol  3. Viral warts, unspecified type Discussed referral to Derm   4. Screening for genitourinary condition -reviewed - POCT Urinalysis Dipstick

## 2019-05-03 ENCOUNTER — Encounter: Payer: Self-pay | Admitting: Registered"

## 2019-05-03 ENCOUNTER — Encounter: Payer: No Typology Code available for payment source | Admitting: Registered"

## 2019-05-03 DIAGNOSIS — F5001 Anorexia nervosa, restricting type: Secondary | ICD-10-CM

## 2019-05-03 NOTE — [facility] Notes (Signed)
Appointment start time: 8:30 Appointment end time: 8:58  Patient was seen on 05/03/2019 for nutrition counseling pertaining to disordered eating  Primary care provider: Dr. Willa Rough Therapist: Carollee Herter at adolescent medicine  ROI: N/A Any other medical team members: adolescent Parents: none   Assessment  Pt arrives stating things are going well. Reports being able to exercise again and excited about it. States she has been walking and rode her bike once. Pt states she visited [employer] last week with mom and rode bikes while on campus. States she has not been running; waiting to ease into it. Has increased intake (sometimes having 4 Ensures/day) since beginning exercise routine. States she feels less bloated.   Reports her motivation is the ability to use her treadmill again and willing to earn this privilege.   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 Pt states she used to swim in high school; year-round swimming. Pt states she likes almond milk taste rather than regular milk taste. Will have Lactaid when having dairy products such as yogurt. Not a sweets person, prefers savory. Planning to attend [employer] in the fall.   Next visit: ROI for growth charts  Growth Metrics: none included Median BMI for age: 29.5 BMI today: 17.81 % median today:  83% Goal rate of weight gain:  0.5-1.0/week  Eating history: Length of time: Jan-Feb 2019 Previous treatments: once at [facility] for 3 days Goals for RD meetings: improve menstrual period, cold tolerance, normalize food  Weight history:  Highest weight: 145   Lowest weight: 85 Most consistent weight: 105  What would you like to weigh: N/A How has weight changed in the past year: loss of 50 lbs  Medical Information:  Changes in hair, skin, nails since ED started: no Chewing/swallowing difficulties: no  Reflux or heartburn: no Trouble with teeth: no LMP without the use of  hormones: Dec 2018, some spotting since 05/06    Weight at that point: 145  Effect of exercise on menses Decreased cycle  Constipation, diarrhea: no, has BM daily  Dizziness/lightheadedness: no Headaches/body aches: no Heart racing/chest pain: no Mood: good Sleep: pretty good, sleeps 7-8 hrs/night Focus/concentration: good Cold intolerance: yes, improving Vision changes: no  Mental health diagnosis: AN   Dietary assessment: A typical day consists of 3 meals and 3 snacks  Safe foods include: eggs, chicken, [place], ham, fish, seafood, roast beef, all vegetables, all fruit, sweet potatoes, butternut squash, yogurt, soy milk, almond milk, cheerios,  Avoided foods include: gluten, dairy  24 hour recall:  B: 4 eggs + 1 bowl cheerios with coconut shavings + 1 c coffee creamer + Ensure + apple with almond butter + water  S: cheese stick + apple + banana  L: Ensure + avocado + sweet potato + salad honey dijon dressing + fruit + chicken + 1 c coffee creamer + cantaloupe + blueberries + water S: Ensure + blueberries D: [place] 4 eggs + 2 waffles + blueberries + tea + soy milk + salad with salad dressing + sweet potato + avocado S: Ensure + 2 Tbs peanut butter  Beverage: coffee, chocolate almond milk, water   Physical activity: walking, and biking 60 min, 6 days  What Methods Do You Use To Control Your Weight (Compensatory behaviors)?           Restricting (calories, fat, carbs)  Exercise - running  Estimated energy intake: ~3000 kcal  Estimated energy needs: 2400 kcal 300 g CHO 120 g pro  80 g fat  Nutrition Diagnosis: NI-1.4 Inadequate energy intake As related to disordered eating.  As evidenced by weight loss and amenorrhea.  Intervention/Goals: Encouraged pt to continue with great work eating throughout the day. Answered questions pt had. Pt is doing well increasing intake to adequately nourish her body.  Meal plan:    3 meals    3 snacks  Monitoring and Evaluation: Patient  will follow up in 1 weeks.

## 2019-05-05 ENCOUNTER — Encounter: Payer: Self-pay | Admitting: Family

## 2019-05-06 LAB — TESTOS,TOTAL,FREE AND SHBG (FEMALE)
Free Testosterone: 1.6 pg/mL (ref 0.1–6.4)
Sex Hormone Binding: 23 nmol/L (ref 17–124)
Testosterone, Total, LC-MS-MS: 11 ng/dL (ref 2–45)

## 2019-05-08 ENCOUNTER — Encounter: Payer: Self-pay | Admitting: Registered"

## 2019-05-08 ENCOUNTER — Other Ambulatory Visit: Payer: Self-pay

## 2019-05-08 ENCOUNTER — Ambulatory Visit (INDEPENDENT_AMBULATORY_CARE_PROVIDER_SITE_OTHER): Payer: Self-pay | Admitting: Licensed Clinical Social Worker

## 2019-05-08 ENCOUNTER — Encounter: Payer: No Typology Code available for payment source | Attending: Family | Admitting: Registered"

## 2019-05-08 DIAGNOSIS — F4329 Adjustment disorder with other symptoms: Secondary | ICD-10-CM

## 2019-05-08 DIAGNOSIS — F5001 Anorexia nervosa, restricting type: Secondary | ICD-10-CM | POA: Diagnosis not present

## 2019-05-08 DIAGNOSIS — F50019 Anorexia nervosa, restricting type, unspecified: Secondary | ICD-10-CM

## 2019-05-08 LAB — FOLLICLE STIMULATING HORMONE: FSH: 2.4 m[IU]/mL

## 2019-05-08 LAB — DHEA-SULFATE: DHEA-SO4: 303 ug/dL (ref 51–321)

## 2019-05-08 LAB — ANDROSTENEDIONE: Androstenedione: 104 ng/dL

## 2019-05-08 LAB — TSH+FREE T4: TSH W/REFLEX TO FT4: 0.88 mIU/L

## 2019-05-08 LAB — 17-HYDROXYPROGESTERONE: 17-OH-Progesterone, LC/MS/MS: 13 ng/dL

## 2019-05-08 LAB — LUTEINIZING HORMONE: LH: 0.2 m[IU]/mL — ABNORMAL LOW

## 2019-05-08 LAB — PROLACTIN: Prolactin: 10.6 ng/mL

## 2019-05-08 NOTE — Patient Instructions (Signed)
-   Try cottage cheese + coconut shavings + fruit as snack option.   - Continue with the great work!

## 2019-05-08 NOTE — BH Specialist Note (Signed)
Referring Provider: Default, Provider, MD Session Time:  11:00A - 11:40AM (40 minutes) Type of Service: Behavioral Health - Individual Interpreter: Yes.    Interpreter Name & Language: English  COMPREHENSIVE CLINICAL ASSESSMENT  PRESENTING CONCERNS:   Angela [name] is a 18 y.o. female brought in by patient. Angela RollsCaroline M [name]  Previous mental health services Have you ever been treated for a mental health problem, when, where, by whom? Yes  [facility]     Have you ever had a mental health hospitalization, how many times, length of stay? Yes  [facility]x [facility] related to eating disorder.    Have you ever been treated with medication, name, reason, response? Yes  Zyprexa - likes it, sleepy.    Have you ever had suicidal thoughts or attempted suicide, when, how? No      Medical history Medical treatment and/or problems, explain: Yes Eating disorder Name of primary care physician/last physical exam: [facility]- can't remember  Allergies: No NKA    Medication reactions: No NKA               Current medications: Zyprexa Prescribed by: Bernell Listhristy Jones, NP Is there any history of mental health problems or substance abuse in your family, whom? No per patient. Medical team notes that Mom has disordered eating and sister has anxiety. Has anyone in your family been hospitalized, who, where, length of stay? No   Social/family history Who lives in your current household? Mom and patient. Sister usually stays with Dad. Family of origin (childhood history)  Where were you born? [employer] Where did you grow up? [place] How many different homes have you lived? 2 Describe your childhood: Good, swimming was most of the occupation. Always been an athlete. Do you have siblings, step/half siblings, list names, relation, sex, age? Yes Brother, out of the house, [name] 16/F Are your parents  separated/divorced, when and why? Yes  Social supports (personal and professional): Friends, job  Education How many grades have you completed? some college Did you have any problems in school, what type? No    Employment (financial issues) Working as a Social workernanny- summer camp job upcoming.  Sleep: Bedtime is usually at 9 pm.  She sleeps in own bed.  She does not nap during the day. She falls asleep quickly.  She sleeps through the night.    TV is not in the child's room. She is taking Zyprexa. Snoring:  No   Obstructive sleep apnea is not a concern.    Nightmares:  No Night terrors:  No Sleepwalking:  No  Trauma/Abuse history: Have you ever been exposed to any form of abuse, what type? No  Have you ever been exposed to something traumatic, describe? No   Substance use Do you use Caffeine? Yes Type, frequency? Coffee  Do you use Nicotine? No Type, frequency, ppd?   Do you use Alcohol? No  Type, frequency?  How old were you when you first tasted alcohol? 17    Have you ever used illicit drugs or taken more than prescribed, type, frequency, date of last usage? No   Mental Status: General Appearance Angela [name]/Behavior:  Neat Eye Contact:  Good Motor Behavior:  Normal Speech:  Normal Level of Consciousness:  Alert Mood:  Euthymic Affect:  Appropriate Anxiety Level:  None Thought Process:  Coherent and Relevant Thought Content:  WNL Perception:  Normal Judgment:  Good Insight:  Present   Diagnosis Eating disorder  GOALS ADDRESSED:  Meet goals around weight, eating Decrease anxious thoughts Increase motivation to adhere to plan    INTERVENTIONS:  CBT, Solution-Focused   ASSESSMENT/OUTCOME:  Patient to continue with RD, NP, and IBH   PLAN:  Continue IBH  Scheduled next visit: 6/9 145P   [name] LCSWA

## 2019-05-08 NOTE — Progress Notes (Signed)
Appointment start time: 8:00 Appointment end time: 8:32  Patient was seen on 05/08/2019 for nutrition counseling pertaining to disordered eating  Primary care provider: Dr. Willa Roughueben Therapist: Carollee HerterShannon at adolescent medicine  ROI: 05/08/2019 Any other medical team members: adolescent Parents: none   Assessment  Pt arrives stating things are going well. Next appt with [facility] on tomorrow 06/03. Still drinking 3 Ensures/day. Went to swim practice yesterday for 20 minutes; states it went well. Walking outside/treadmill for 60 minutes/6 days a week. Wants to be less dependant on Ensure and replace with food.   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 Trying to plan with changes to daily schedule. States [place] camp [place] but may be as late as 06/28.   Reports her motivation is the ability to use her treadmill again and willing to earn this privilege.   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 Pt states she used to swim in high school; year-round swimming. Pt states she likes almond milk taste rather than regular milk taste. Will have Lactaid when having dairy products such as yogurt. Not a sweets person, prefers savory. Planning to attend [employer] in the fall.     Growth Metrics: none included Median BMI for age: 6121.5 BMI today: 17.81 % median today:  83% Goal rate of weight gain:  0.5-1.0/week  Eating history: Length of time: Jan-Feb 2019 Previous [facility]: [facility] Goals for RD meetings: improve menstrual period, cold tolerance, normalize food  Weight history:  Highest weight: 145   Lowest weight: 85 Most consistent weight: 105  What would you like to weigh: N/A How has weight changed in the past year: loss of 50 lbs  Medical Information:  Changes in hair, skin, nails since ED started: no Chewing/swallowing difficulties: no  Reflux or heartburn: no Trouble with teeth:  no LMP without the use of hormones: Dec 2018, some spotting since 05/06    Weight at that point: 145  Effect of exercise on menses Decreased cycle  Constipation, diarrhea: no, has BM daily  Dizziness/lightheadedness: no Headaches/body aches: no Heart racing/chest pain: no Mood: good Sleep: pretty good, sleeps 7-8 hrs/night Focus/concentration: good Cold intolerance: yes, improving Vision changes: no  Mental health diagnosis: AN   Dietary assessment: A typical day consists of 3 meals and 3 snacks  Safe foods include: eggs, chicken, [place], ham, fish, seafood, roast beef, all vegetables, all fruit, sweet potatoes, butternut squash, yogurt, soy milk, almond milk, cheerios,  Avoided foods include: gluten, dairy  24 hour recall:  B: 4 eggs + 2 waffles + apple + chocolate peanut butter + 1 c coffee creamer + Ensure + water  S: peanut butter + apple + banana  L: quiche (eggs + ham + spinach + onions + peppers) + Rx bar + Ensure  S: Ensure + blueberries D: [place] + tea + chocolate almond milk + salad with salad dressing + sweet potato + water S: trail mix   Beverage: coffee, chocolate almond milk, water, tea  Physical activity: walking, and biking 60 min, 6 days  What Methods Do You Use To Control Your Weight (Compensatory behaviors)?           Restricting (calories, fat, carbs)  Exercise - running  Estimated energy intake: ~3000 kcal  Estimated energy needs: 2400 kcal 300 g CHO 120 g pro 80 g fat  Nutrition Diagnosis: NI-1.4 Inadequate energy intake As related to disordered  eating.  As evidenced by weight loss and amenorrhea.  Intervention/Goals: Encouraged pt to continue with great work eating throughout the day. Discussed food groups to include in meals and snacks and ways to have adequate nourishment. Answered questions pt had. Pt is doing well increasing intake to adequately nourish her body. Goals: - Try cottage cheese + coconut shavings + fruit as snack option.  -  Continue with the great work!  Meal plan:    3 meals    3 snacks  Monitoring and Evaluation: Patient will follow up in 1 weeks.

## 2019-05-09 ENCOUNTER — Ambulatory Visit (INDEPENDENT_AMBULATORY_CARE_PROVIDER_SITE_OTHER): Payer: Self-pay | Admitting: Family

## 2019-05-09 ENCOUNTER — Encounter: Payer: Self-pay | Admitting: Family

## 2019-05-09 VITALS — BP 104/58 | HR 68 | Ht 62.76 in | Wt 100.2 lb

## 2019-05-09 DIAGNOSIS — Z1389 Encounter for screening for other disorder: Secondary | ICD-10-CM

## 2019-05-09 DIAGNOSIS — F5001 Anorexia nervosa, restricting type: Secondary | ICD-10-CM

## 2019-05-09 DIAGNOSIS — F50019 Anorexia nervosa, restricting type, unspecified: Secondary | ICD-10-CM

## 2019-05-09 LAB — POCT URINALYSIS DIPSTICK
Bilirubin, UA: NEGATIVE
Blood, UA: NEGATIVE
Glucose, UA: NEGATIVE
Ketones, UA: NEGATIVE
Leukocytes, UA: NEGATIVE
Nitrite, UA: NEGATIVE
Protein, UA: NEGATIVE
Spec Grav, UA: <=1.005 — AB (ref 1.010–1.025)
Urobilinogen, UA: NEGATIVE E.U./dL — AB
pH, UA: 7 (ref 5.0–8.0)

## 2019-05-09 MED ORDER — FLUOXETINE HCL 20 MG PO CAPS: 20.0000 mg | ORAL_CAPSULE | Freq: Every day | ORAL | 0 refills | Status: DC

## 2019-05-09 NOTE — Progress Notes (Signed)
History was provided by the patient.  Angela [name] is a 18 y.o. female who is here for weekly follow-up for anorexia nervosa, restricting type.   PCP confirmed? Yes.    Default, Provider, MD  HPI:   -no complaints or pains, week went well  -she swam for 20 minutes -she is excited for camp [employer], unsure when they will open  -taking zyprexa at night, still gets really sleepy after her dose -no si/hi, no self harm -derm called; needed her insurance info; she is getting that from dad  Review of Systems  Constitutional: Negative for chills, fever and malaise/fatigue.  HENT: Negative for sore throat.   Respiratory: Negative for cough and shortness of breath.   Cardiovascular: Negative for chest pain and palpitations.  Gastrointestinal: Negative for abdominal pain, constipation and vomiting.  Genitourinary: Negative for dysuria and frequency.  Musculoskeletal: Negative for joint pain and myalgias.  Skin: Negative for rash.  Neurological: Negative for dizziness and headaches.  Psychiatric/Behavioral: Negative for depression and suicidal ideas. The patient is not nervous/anxious.       Patient Active Problem List   Diagnosis Date Noted  . Macrocytosis   . Bradycardia 08/14/2018  . Hypophosphatemia 08/09/2018  . Eating disorder 08/08/2018  . Moderate protein-calorie malnutrition ([place]) 08/08/2018  . Chronic bilateral low back pain without sciatica 02/05/2018    Current Outpatient Medications on File Prior to Visit  Medication Sig Dispense Refill  . feeding supplement, ENSURE ENLIVE, (ENSURE ENLIVE) LIQD Take 237 mLs by mouth as needed (if meal goal not met). 237 mL 12  . Multiple Vitamin (MULTIVITAMIN WITH MINERALS) TABS tablet Take 1 tablet by mouth daily. 30 tablet 0  . OLANZapine (ZYPREXA) 2.5 MG tablet Take 1 tablet (2.5 mg total) by mouth daily. 90 tablet 0   No current facility-administered medications on file prior to visit.     No Known Allergies  Physical Exam:    Vitals:   05/09/19 0944  BP: (!) 104/58  Pulse: 68  Weight: 100 lb 3.2 oz (45.5 kg)  Height: 5' 2.76" (1.594 m)   Wt Readings from Last 3 Encounters:  05/09/19 100 lb 3.2 oz (45.5 kg) (4 %, Z= -1.72)*  05/02/19 100 lb (45.4 kg) (4 %, Z= -1.74)*  04/25/19 100 lb (45.4 kg) (4 %, Z= -1.74)*   * Growth percentiles are based on CDC (Girls, 2-20 Years) data.     Blood pressure percentiles are not available for patients who are 18 years or older. No LMP recorded.  Physical Exam Vitals signs reviewed.  Constitutional:      General: She is not in acute distress. HENT:     Head: Normocephalic.     Mouth/Throat:     Mouth: Mucous membranes are dry.     Pharynx: No oropharyngeal exudate.  Eyes:     Extraocular Movements: Extraocular movements intact.     Pupils: Pupils are equal, round, and reactive to light.  Neck:     Musculoskeletal: Normal range of motion.  Cardiovascular:     Rate and Rhythm: Normal rate and regular rhythm.     Heart sounds: No murmur.  Pulmonary:     Effort: Pulmonary effort is normal.  Abdominal:     General: Abdomen is flat.     Palpations: Abdomen is soft.  Musculoskeletal: Normal range of motion.        General: No swelling.  Skin:    General: Skin is warm and dry.     Capillary Refill: Capillary refill  takes less than 2 seconds.     Coloration: Skin is not pale.     Findings: No rash.  Neurological:     General: No focal deficit present.     Mental Status: She is alert and oriented to person, place, and time.  Psychiatric:        Mood and Affect: Mood normal.     Assessment/Plan: 1. Anorexia nervosa, restricting type -weight continues to be stable with increased activity and exercise is tolerated -discussed initiation of SSRI; will continue zyprexa for 2-3 weeks then discontinue  -take fluoxetine 20 mg daily in AM  -return precautions and BBW reviewed  2. Screening for genitourinary condition -reviewed, no ketones, no protein.  - POCT  Urinalysis Dipstick

## 2019-05-15 ENCOUNTER — Telehealth: Payer: Self-pay | Admitting: *Deleted

## 2019-05-15 ENCOUNTER — Encounter: Payer: No Typology Code available for payment source | Admitting: Registered"

## 2019-05-15 ENCOUNTER — Ambulatory Visit (INDEPENDENT_AMBULATORY_CARE_PROVIDER_SITE_OTHER): Payer: Self-pay | Admitting: Licensed Clinical Social Worker

## 2019-05-15 ENCOUNTER — Other Ambulatory Visit: Payer: Self-pay

## 2019-05-15 DIAGNOSIS — F4329 Adjustment disorder with other symptoms: Secondary | ICD-10-CM

## 2019-05-15 DIAGNOSIS — F5001 Anorexia nervosa, restricting type: Secondary | ICD-10-CM | POA: Diagnosis not present

## 2019-05-15 NOTE — Patient Instructions (Signed)
-   Try Lactaid 2% milk.   - Try "fun food" this week.

## 2019-05-15 NOTE — Progress Notes (Signed)
Appointment start time: 9:20 Appointment end time: 10:15  Patient was seen on 05/15/2019 for nutrition counseling pertaining to disordered eating  Primary care provider: Dr. Denyse Amass Therapist: Larene Beach at adolescent medicine  ROI: 05/08/2019 Any other medical team members: adolescent Parents: none   Assessment  Pt arrives stating things are going well. Next appt with [place] on tomorrow 06/10. Still drinking 2-3 Ensures/day. States she is not doing anything to control weight gain. Has been swimming more this week: 20-30 min, 5 days/week + bike ride with mom, walked 1 mi on treadmill. Has not been able to swim as much as she used to.    Wants to be less dependant on Ensure and replace with food. Pt states she likes almond milk whip cream; does not prefer ice cream, prefers sherbet, sorbet, and smoothies.   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 Pt states she used to swim in high school; year-round swimming. Pt states she likes almond milk taste rather than regular milk taste. Will have Lactaid when having dairy products such as yogurt. Not a sweets person, prefers savory. Planning to attend [employer] in the fall.    Growth Metrics:   Median BMI for age: 15.5 BMI today: 17.81 % median today:  83% Previous growth data: weight/age  ~50th %; height/age at 25th %; BMI/age N/A Goal rate of weight gain:  0.5-1.0/week  Eating history: Length of time: Jan-Feb 2019 Previous treatments: once at [facility] for 3 days Goals for RD meetings: improve menstrual period, cold tolerance, normalize food  Weight history:   Current weight: ~100 lbs (has maintained this weight since 05/20) Highest weight: 145   Lowest weight: 85 Most consistent weight: 105  What would you like to weigh: N/A How has weight changed in the past year: loss of 50 lbs  Medical Information:  Changes in hair,  skin, nails since ED started: no Chewing/swallowing difficulties: no  Reflux or heartburn: no Trouble with teeth: no LMP without the use of hormones: Dec 2018, some spotting since 05/06    Weight at that point: 145  Effect of exercise on menses Decreased cycle  Constipation, diarrhea: no, has BM daily  Dizziness/lightheadedness: no Headaches/body aches: no Heart racing/chest pain: no Mood: good Sleep: pretty good, sleeps 7-8 hrs/night Focus/concentration: good Cold intolerance: yes, improving Vision changes: no  Mental health diagnosis: AN   Dietary assessment: A typical day consists of 3 meals and 3 snacks  Safe foods include: eggs, chicken, [place], ham, fish, seafood, roast beef, all vegetables, all fruit, sweet potatoes, butternut squash, yogurt, soy milk, almond milk, cheerios,   Avoided foods include: gluten, dairy  24 hour recall:  B: 4 eggs + rice chex cereal + soy milk + coconut shavings + apple + 1 c coffee creamer + Ensure  S: peanuts + apple + banana  L: 2 sweet potatoes + honey baked ham + parmesan cheese + carrots + coffee + creamer S: Ensure + Rx bar D: chicken + sweet potato + quinoa + chocolate almond milk + salad with salad dressing  S: almonds + Ensure + Rx bar  Beverage: coffee, chocolate almond milk, water, tea, Ensure  Physical activity: walking, and biking 60 min, 6 days  What Methods Do You Use To Control Your Weight (Compensatory behaviors)?           Restricting (calories, fat, carbs)  Exercise - running  Estimated energy intake: ~3000 kcal  Estimated energy needs: 2400 kcal 300 g CHO 120 g pro 80 g fat  Nutrition Diagnosis: NI-1.4 Inadequate energy intake As related to disordered eating.  As evidenced by weight loss and amenorrhea.  Intervention/Goals: Encouraged pt to continue with great work eating throughout the day. Discussed fun foods and options to add to her intake. Pt is motivated and made the suggestion of trying a "fun food"  this week.  Answered questions pt had. Pt is doing well increasing intake to adequately nourish her body. Goals: - Try Lactaid 2% milk.  - Try "fun food" this week.   Meal plan:    3 meals    3 snacks  Monitoring and Evaluation: Patient will follow up in 1 week.

## 2019-05-15 NOTE — BH Specialist Note (Signed)
Integrated Behavioral Health via Telemedicine Video Visit  05/15/2019 Angela [name] [id number]  Number of Strong visits: 7 ( CCA completed on 05/08/2019) Session Start time: 1:45P  Session End time: 2:06 PM Total time: 21 minutes  Referring Provider: Hoyt Koch , NP Type of Visit: Video Patient/Family location: home [employer] Provider location: Remote home office All persons participating in visit: Patient and [facility]  Confirmed patient's address: Yes  Confirmed patient's phone number: Yes  Any changes to demographics: No   Confirmed patient's insurance: Yes  Any changes to patient's insurance: No   Discussed confidentiality: Yes   I connected with [name] and/or [place] patient by a video enabled telemedicine application and verified that I am speaking with the correct person using two identifiers.     I discussed the limitations of evaluation and management by telemedicine and the availability of in person appointments.  I discussed that the purpose of this visit is to provide behavioral health care while limiting exposure to the novel coronavirus.   Discussed there is a possibility of technology failure and discussed alternative modes of communication if that failure occurs.  I discussed that engaging in this video visit, they consent to the provision of behavioral healthcare and the services will be billed under their insurance.  Patient and/or legal guardian expressed understanding and consented to video visit: Yes   PRESENTING CONCERNS: Patient and/or family reports the following symptoms/concerns: Starting camp on 7/1, reports meeting her goals and no concerns. Duration of problem: Years; Severity of problem: moderate  STRENGTHS (Protective Factors/Coping Skills): Smart, willing to participate  GOALS ADDRESSED: Patient will: 1.  Reduce symptoms of: behavior concerns  2.  Increase knowledge and/or ability of: coping skills and  healthy habits  3.  Demonstrate ability to: Increase healthy adjustment to current life circumstances  INTERVENTIONS: Interventions utilized:  Motivational Interviewing, Behavioral Activation, Supportive Counseling and Psychoeducation and/or Health Education Standardized Assessments completed: Not Needed  ASSESSMENT: Patient currently experiencing meeting goals per patient, no concerns per patient.   Patient may benefit from coping skills, self-care. Moving visits to every 2 weeks.  PLAN: 1. Follow up with behavioral health clinician on : 6/23 2. Behavioral recommendations: Continue with healthy habits 3. Referral(s): [place] (In Clinic)  I discussed the assessment and treatment plan with the patient and/or parent/guardian. They were provided an opportunity to ask questions and all were answered. They agreed with the plan and demonstrated an understanding of the instructions.   They were advised to call back or seek an in-person evaluation if the symptoms worsen or if the condition fails to improve as anticipated.  [name]

## 2019-05-15 NOTE — Telephone Encounter (Signed)
Called patient to pre-screen but no answer. Was not able to leave a message.

## 2019-05-16 ENCOUNTER — Encounter: Payer: Self-pay | Admitting: Family

## 2019-05-16 ENCOUNTER — Ambulatory Visit (INDEPENDENT_AMBULATORY_CARE_PROVIDER_SITE_OTHER): Payer: Self-pay | Admitting: Family

## 2019-05-16 VITALS — BP 103/64 | HR 61 | Ht 62.68 in | Wt 100.2 lb

## 2019-05-16 DIAGNOSIS — N911 Secondary amenorrhea: Secondary | ICD-10-CM

## 2019-05-16 DIAGNOSIS — F5001 Anorexia nervosa, restricting type: Secondary | ICD-10-CM

## 2019-05-16 DIAGNOSIS — Z1389 Encounter for screening for other disorder: Secondary | ICD-10-CM

## 2019-05-16 LAB — POCT URINALYSIS DIPSTICK
Bilirubin, UA: NEGATIVE
Blood, UA: NEGATIVE
Glucose, UA: NEGATIVE
Ketones, UA: NEGATIVE
Leukocytes, UA: NEGATIVE
Nitrite, UA: NEGATIVE
Protein, UA: NEGATIVE
Spec Grav, UA: <=1.005 — AB (ref 1.010–1.025)
Urobilinogen, UA: NEGATIVE E.U./dL — AB
pH, UA: 7 (ref 5.0–8.0)

## 2019-05-16 NOTE — Progress Notes (Signed)
History was provided by the patient.  Angela [name] is a 18 y.o. female who is here for anorexia nervosa, restricting type, secondary amenorrhea.   PCP confirmed? Yes.    Truddie Coco, MD   HPI:   -she did not pick up fluoxetine, forgot her mask -will pick it up today  -she reports having 4 large BMs, acknowledges how different her stools are now that she is eating better -she swam but not for long, was not totally comfortable in pool unless she had her own lane -picked up her fun food - dark chocolate; was considering whip cream as she used to eat that out of the tub; there is a new birthday one with toppings she was considering   Review of Systems  Constitutional: Negative for fever.  HENT: Negative for sore throat.   Eyes: Negative for blurred vision, double vision and pain.  Respiratory: Negative for cough and shortness of breath.   Cardiovascular: Negative for chest pain and palpitations.  Gastrointestinal: Negative for abdominal pain, constipation, nausea and vomiting.  Genitourinary: Negative for dysuria and frequency.  Musculoskeletal: Negative for joint pain and myalgias.  Neurological: Negative for dizziness and headaches.  Psychiatric/Behavioral: Negative for depression and suicidal ideas. The patient is nervous/anxious.      Patient Active Problem List   Diagnosis Date Noted  . Macrocytosis   . Hypophosphatemia 08/09/2018  . Eating disorder 08/08/2018  . Moderate protein-calorie malnutrition ([place]) 08/08/2018  . Chronic bilateral low back pain without sciatica 02/05/2018    Current Outpatient Medications on File Prior to Visit  Medication Sig Dispense Refill  . feeding supplement, ENSURE ENLIVE, (ENSURE ENLIVE) LIQD Take 237 mLs by mouth as needed (if meal goal not met). 237 mL 12  . FLUoxetine (PROZAC) 20 MG capsule Take 1 capsule (20 mg total) by mouth daily. 30 capsule 0  . Multiple Vitamin (MULTIVITAMIN WITH MINERALS) TABS tablet Take 1 tablet by mouth daily. 30  tablet 0  . OLANZapine (ZYPREXA) 2.5 MG tablet Take 1 tablet (2.5 mg total) by mouth daily. 90 tablet 0   No current facility-administered medications on file prior to visit.     No Known Allergies  Physical Exam:    Vitals:   05/16/19 0934  BP: 103/64  Pulse: 61  Weight: 100 lb 3.2 oz (45.5 kg)  Height: 5' 2.68" (1.592 m)   Wt Readings from Last 3 Encounters:  05/16/19 100 lb 3.2 oz (45.5 kg) (4 %, Z= -1.72)*  05/09/19 100 lb 3.2 oz (45.5 kg) (4 %, Z= -1.72)*  05/02/19 100 lb (45.4 kg) (4 %, Z= -1.74)*   * Growth percentiles are based on CDC (Girls, 2-20 Years) data.    Blood pressure percentiles are not available for patients who are 18 years or older. No LMP recorded.  Physical Exam Vitals signs reviewed.  Constitutional:      Appearance: Normal appearance. She is not ill-appearing.  HENT:     Head: Normocephalic.  Eyes:     Extraocular Movements: Extraocular movements intact.     Pupils: Pupils are equal, round, and reactive to light.  Neck:     Musculoskeletal: Normal range of motion.  Cardiovascular:     Rate and Rhythm: Normal rate and regular rhythm.     Pulses: Normal pulses.     Heart sounds: No murmur.  Pulmonary:     Effort: Pulmonary effort is normal.  Abdominal:     General: Abdomen is flat.  Musculoskeletal: Normal range of motion.  Lymphadenopathy:  Cervical: No cervical adenopathy.  Skin:    General: Skin is warm.     Capillary Refill: Capillary refill takes less than 2 seconds.     Coloration: Skin is not pale.     Findings: No rash.  Neurological:     General: No focal deficit present.     Mental Status: She is alert and oriented to person, place, and time.  Psychiatric:        Attention and Perception: Attention normal.        Mood and Affect: Mood is anxious.     Assessment/Plan: 1. Anorexia nervosa, restricting type -reviewed the role of LH in the body, reviewed typical lelevated LH/FSH ratios seen in PCOS, discussed she does  not have PCOS as LH and Testosterone are low; reviewed indications for checking for pituitary gland issues; I am not highly suspicious as no other underlying symptoms. Discussed that this menstrual suppression and low LH is likely secondary to malnutrition. Will await labs; progesterone challenge, then ultrasound pending if no withdrawal bleed. Her last bleed was December 2018.  - Thyroid Panel With TSH - Estradiol - ACTH  2. 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 I feel it is in her best interest to continue working to her goal with ongoing monitoring weekly; advised that we will continue session weekly while she is at camp, with all members of treatment team on her weekly days off. Patient is agreeable to this plan with acknowledgement of how much better she feels now than she did before when she was intensively restricting.  3. Screening for genitourinary condition -negative ketones, negative protein, no sign of infection  - POCT Urinalysis Dipstick

## 2019-05-21 LAB — THYROID PANEL WITH TSH
Free Thyroxine Index: 1.5 (ref 1.4–3.8)
T3 Uptake: 31 % (ref 22–35)
T4, Total: 4.7 ug/dL — ABNORMAL LOW (ref 5.3–11.7)
TSH: 0.89 mIU/L

## 2019-05-21 LAB — ESTRADIOL: Estradiol: <15 pg/mL

## 2019-05-21 LAB — EXTRA LAV TOP TUBE

## 2019-05-21 LAB — ACTH

## 2019-05-22 ENCOUNTER — Other Ambulatory Visit: Payer: Self-pay

## 2019-05-22 ENCOUNTER — Encounter: Payer: Self-pay | Admitting: Registered"

## 2019-05-22 ENCOUNTER — Encounter: Payer: No Typology Code available for payment source | Admitting: Registered"

## 2019-05-22 DIAGNOSIS — F5001 Anorexia nervosa, restricting type: Secondary | ICD-10-CM | POA: Diagnosis not present

## 2019-05-22 NOTE — Progress Notes (Signed)
Appointment start time: 9:20 Appointment end time: 9:48  Patient was seen on 05/22/2019 for nutrition counseling pertaining to disordered eating  Primary care provider: Dr. Denyse Amass Therapist: Larene Beach at adolescent medicine  ROI: 05/08/2019 Any other medical team members: adolescent Parents: none   Assessment  Pt states she has an interview with [employer] in [place] today at [place] states she tried Lindor chocolate bar as "fun food" and ate 2 squares after dinner. States she also added cranberry juice. Reports typically drinking about 64 ounces water/day. Still drinking 2-3 Ensures/day. States she is not doing anything to control weight gain.   States she is leaving for camp on 06/29, camp begins on 07/05. States she returns from camp on 08/15.   Previous appts: Wants to be less dependant on Ensure and replace with food. Pt states she likes almond milk whip cream; does not prefer ice cream, prefers sherbet, sorbet, and smoothies. Pt states she likes yoga, to run long distances, nanny, babysit, and cook. Pt states she used to swim in high school; year-round swimming. Pt states she likes almond milk taste rather than regular milk taste. Will have Lactaid when having dairy products such as yogurt. Not a sweets person, prefers savory. Planning to attend [employer] in the fall.    Growth Metrics:   Median BMI for age: 29.5 BMI today: 17.81 % median today:  83% Previous growth data: weight/age  ~50th %; height/age at 25th %; BMI/age N/A Goal rate of weight gain:  0.5-1.0/week  Eating history: Length of time: Jan-Feb 2019 Previous treatments: once at [facility] for 3 days Goals for RD meetings: improve menstrual period, cold tolerance, normalize food  Weight history:   Current weight: 101.2 lbs (has gained 1.2 lbs since last week appt 100 on 06/09) Highest weight: 145   Lowest weight: 85 Most consistent weight: 105  What would you like to weigh: N/A How has weight changed in  the past year: loss of 50 lbs  Medical Information:  Changes in hair, skin, nails since ED started: no Chewing/swallowing difficulties: no  Reflux or heartburn: no Trouble with teeth: no LMP without the use of hormones: Dec 2018, some spotting since 05/06    Weight at that point: 145  Effect of exercise on menses Decreased cycle  Constipation, diarrhea: no, has BM 1-2x daily  Dizziness/lightheadedness: no Headaches/body aches: no Heart racing/chest pain: no Mood: good Sleep: pretty good, sleeps 7-8 hrs/night Focus/concentration: good Cold intolerance: yes, improving Vision changes: no  Mental health diagnosis: AN   Dietary assessment: A typical day consists of 3 meals and 3 snacks  Safe foods include: eggs, chicken, [place], ham, fish, seafood, roast beef, all vegetables, all fruit, sweet potatoes, butternut squash, yogurt, soy milk, almond milk, cheerios,   Avoided foods include: gluten, dairy  24 hour recall:  B: 4 eggs + rice chex cereal + almond milk + 1 Tbs peanut butter + apple + 1 c coffee creamer + Ensure + water S: almond butter + apple + banana  L: chicken + salad + avocado + 2 sweet potatoes + salad dressing + coffee + creamer + water + Ensure S: Ensure + cheerios + almond milk + coconut shavings D: [place] + carrots + avocado + tea + water + almond milk + butternut squash + raspberry vinaigrette + 2 squares of Lindor  S: cottage cheese + trail mix + blueberries   Beverage: coffee, chocolate almond milk, water, tea, Ensure, cranberry juice  Physical activity: swimming 2x 20-30 min, walking  2x 60 min, hiking once  What Methods Do You Use To Control Your Weight (Compensatory behaviors)?           Restricting (calories, fat, carbs)  Exercise - running  Estimated energy intake: ~3000 kcal  Estimated energy needs: 2400 kcal 300 g CHO 120 g pro 80 g fat  Nutrition Diagnosis: NI-1.4 Inadequate energy intake As related to disordered eating.  As evidenced by  weight loss and amenorrhea.  Intervention/Goals: Encouraged pt to continue with great work eating throughout the day. Reinforced message about the goal being to replenish and restore her body. Discussed thoughts and feelings related to trying chocolate bar. Reminded pt food should be enjoyed and our body deserves to be adequately nourished. Discussed other fun foods and options to add to her intake. Informed pt that we will continue to follow-up with her weekly even while at camp. Pt signed ROI to contact camp to gather nutrition information. Pt is doing well increasing intake to adequately nourish her body. Goals: - Try another "fun food".    Meal plan:    3 meals    3 snacks  Monitoring and Evaluation: Patient will follow up in 1 week.

## 2019-05-22 NOTE — Patient Instructions (Addendum)
-   Try another "fun food".

## 2019-05-23 ENCOUNTER — Encounter: Payer: Self-pay | Admitting: Pediatrics

## 2019-05-23 ENCOUNTER — Other Ambulatory Visit: Payer: Self-pay

## 2019-05-23 ENCOUNTER — Ambulatory Visit (INDEPENDENT_AMBULATORY_CARE_PROVIDER_SITE_OTHER): Payer: Self-pay | Admitting: Pediatrics

## 2019-05-23 VITALS — BP 98/65 | HR 55 | Ht 66.73 in | Wt 98.0 lb

## 2019-05-23 DIAGNOSIS — Z1389 Encounter for screening for other disorder: Secondary | ICD-10-CM

## 2019-05-23 LAB — POCT URINALYSIS DIPSTICK
Bilirubin, UA: NEGATIVE
Blood, UA: NEGATIVE
Glucose, UA: NEGATIVE
Ketones, UA: NEGATIVE
Leukocytes, UA: NEGATIVE
Nitrite, UA: NEGATIVE
Protein, UA: NEGATIVE
Spec Grav, UA: 1.01 (ref 1.010–1.025)
Urobilinogen, UA: NEGATIVE E.U./dL — AB
pH, UA: 8 (ref 5.0–8.0)

## 2019-05-23 NOTE — Progress Notes (Signed)
Pt here today for vitals check. Collaborated with NP- plan of care made. Follow up scheduled for 6/22. Strict return precautions given and patient placed on bedrest until follow up with [name]. Enforced need to have frequent high calorie snacks in between meals and push fluids. Patient admitted to going on a 9 mile hike on Sunday and afterwards did not feel well. If symptoms worsen or persist- she agrees to call the office.

## 2019-05-25 ENCOUNTER — Encounter: Payer: Self-pay | Admitting: Family

## 2019-05-27 ENCOUNTER — Encounter: Payer: Self-pay | Admitting: Family

## 2019-05-28 ENCOUNTER — Encounter: Payer: Self-pay | Admitting: Family

## 2019-05-28 ENCOUNTER — Ambulatory Visit (INDEPENDENT_AMBULATORY_CARE_PROVIDER_SITE_OTHER): Payer: Self-pay | Admitting: Family

## 2019-05-28 ENCOUNTER — Other Ambulatory Visit: Payer: Self-pay

## 2019-05-28 VITALS — BP 103/64 | HR 74 | Ht 62.6 in | Wt 98.4 lb

## 2019-05-28 DIAGNOSIS — Z1389 Encounter for screening for other disorder: Secondary | ICD-10-CM

## 2019-05-28 DIAGNOSIS — N911 Secondary amenorrhea: Secondary | ICD-10-CM

## 2019-05-28 DIAGNOSIS — F5001 Anorexia nervosa, restricting type: Secondary | ICD-10-CM

## 2019-05-28 LAB — POCT URINALYSIS DIPSTICK
Bilirubin, UA: NEGATIVE
Blood, UA: NEGATIVE
Glucose, UA: NEGATIVE
Ketones, UA: NEGATIVE
Leukocytes, UA: NEGATIVE
Nitrite, UA: NEGATIVE
Protein, UA: NEGATIVE
Spec Grav, UA: <=1.005 — AB (ref 1.010–1.025)
Urobilinogen, UA: NEGATIVE E.U./dL — AB
pH, UA: 7 (ref 5.0–8.0)

## 2019-05-28 MED ORDER — OLANZAPINE 2.5 MG PO TABS: 2.5000 mg | ORAL_TABLET | Freq: Every day | ORAL | 0 refills | Status: DC

## 2019-05-28 MED ORDER — HYDROXYZINE HCL 10 MG PO TABS: 10.0000 mg | ORAL_TABLET | Freq: Three times a day (TID) | ORAL | 0 refills | Status: DC | PRN

## 2019-05-28 NOTE — Progress Notes (Signed)
History was provided by the patient.  Angela [name] is a 18 y.o. female who is here for Anorexia Nervosa, Restricting Type, secondary amenorrhea.   PCP confirmed? Yes, Truddie Coco, MD   HPI:   -she has limited activity since last week -tearful and really wants to walk  -discussed camp not happening, no back up plan at this point  -discussed whether hiking with mom was a trigger, she does not feel it was -reviewed labs and amenorrhea, she wants to wait until next week to do the ultrasound  -no si/hi, feels really tired with fluoxetine, is fine with zyprexa -wants to stop fluoxetine and consider a different medication   Review of Systems  Constitutional: Negative for chills, fever and weight loss.  HENT: Negative for sore throat.   Eyes: Negative for blurred vision and double vision.  Respiratory: Negative for cough and shortness of breath.   Cardiovascular: Negative for chest pain and palpitations.  Gastrointestinal: Negative for abdominal pain, diarrhea, heartburn and vomiting.  Genitourinary: Negative for dysuria and frequency.  Musculoskeletal: Negative for myalgias.  Skin: Negative for rash.  Neurological: Negative for dizziness, loss of consciousness and headaches.  Psychiatric/Behavioral: The patient is nervous/anxious.      Patient Active Problem List   Diagnosis Date Noted  . Macrocytosis   . Hypophosphatemia 08/09/2018  . Eating disorder 08/08/2018  . Moderate protein-calorie malnutrition ([place]) 08/08/2018  . Chronic bilateral low back pain without sciatica 02/05/2018    Current Outpatient Medications on File Prior to Visit  Medication Sig Dispense Refill  . feeding supplement, ENSURE ENLIVE, (ENSURE ENLIVE) LIQD Take 237 mLs by mouth as needed (if meal goal not met). 237 mL 12  . FLUoxetine (PROZAC) 20 MG capsule Take 1 capsule (20 mg total) by mouth daily. 30 capsule 0  . Multiple Vitamin (MULTIVITAMIN WITH MINERALS) TABS tablet Take 1 tablet by mouth daily. 30  tablet 0  . OLANZapine (ZYPREXA) 2.5 MG tablet Take 1 tablet (2.5 mg total) by mouth daily. 90 tablet 0   No current facility-administered medications on file prior to visit.     No Known Allergies  Physical Exam:    Vitals:   05/28/19 1008 05/28/19 1021  BP: 104/65 103/64  Pulse: (!) 53 74  Weight: 98 lb 6.4 oz (44.6 kg)   Height: 5' 2.6" (1.59 m)    Wt Readings from Last 3 Encounters:  05/28/19 98 lb 6.4 oz (44.6 kg) (3 %, Z= -1.90)*  05/23/19 98 lb (44.5 kg) (3 %, Z= -1.93)*  05/16/19 100 lb 3.2 oz (45.5 kg) (4 %, Z= -1.72)*   * Growth percentiles are based on CDC (Girls, 2-20 Years) data.    Blood pressure percentiles are not available for patients who are 18 years or older. No LMP recorded.  Physical Exam Constitutional:      General: She is not in acute distress. HENT:     Head: Normocephalic.     Mouth/Throat:     Mouth: Mucous membranes are dry.  Eyes:     Extraocular Movements: Extraocular movements intact.     Pupils: Pupils are equal, round, and reactive to light.  Cardiovascular:     Rate and Rhythm: Normal rate and regular rhythm.     Heart sounds: No murmur.  Pulmonary:     Effort: Pulmonary effort is normal.  Abdominal:     General: Abdomen is flat.     Palpations: Abdomen is soft.  Musculoskeletal: Normal range of motion.  Skin:    General:  Skin is warm and dry.     Capillary Refill: Capillary refill takes less than 2 seconds.     Comments: R thigh lateral - wart has been scratched; no sign of infection, erythematous   Neurological:     General: No focal deficit present.     Mental Status: She is alert and oriented to person, place, and time.  Psychiatric:        Mood and Affect: Mood is anxious. Affect is tearful.     Assessment/Plan: 1. Anorexia nervosa, restricting type -reviewed the ebbs and flows of recovery and that all is not lost with her recent weight loss; advised her to walk only 30 minutes twice per week until she begins to gain  again. She was agreeable to this plan; we also discussed fluoxetine and her tiredness; she will wean off fluoxetine and we will trial another medication; explained that we could start a new SSRI immediately but she was not interested in that at this time; discussed hydroxyzine and she was open to triial this for anxiety. Return precautions given. Considering Lexapro or other SSRI in adjunct with olanzapine use.  2. Secondary amenorrhea -discussed need for u/s due to amenorrhea and historically low estradiol (5.0), FSH is 2.4 and LH is 0.2; The typical biochemical features of POI include low serum estradiol and elevated serum FSH, similar to that in menopause. Will assess AMH and adrenal antibodies for autoimmune adrenal insufficiency. Of note, prolactin level and thyroid panel were normal on 05/02/2019. Bone density ordered also.   3. Screening for genitourinary condition -WNL  - POCT Urinalysis Dipstick

## 2019-05-29 ENCOUNTER — Encounter: Payer: Self-pay | Admitting: Registered"

## 2019-05-29 ENCOUNTER — Encounter: Payer: No Typology Code available for payment source | Admitting: Registered"

## 2019-05-29 ENCOUNTER — Other Ambulatory Visit: Payer: Self-pay

## 2019-05-29 ENCOUNTER — Ambulatory Visit (INDEPENDENT_AMBULATORY_CARE_PROVIDER_SITE_OTHER): Payer: Self-pay | Admitting: Licensed Clinical Social Worker

## 2019-05-29 DIAGNOSIS — F50019 Anorexia nervosa, restricting type, unspecified: Secondary | ICD-10-CM

## 2019-05-29 DIAGNOSIS — F5001 Anorexia nervosa, restricting type: Secondary | ICD-10-CM

## 2019-05-29 DIAGNOSIS — F4329 Adjustment disorder with other symptoms: Secondary | ICD-10-CM

## 2019-05-29 NOTE — BH Specialist Note (Signed)
Integrated Behavioral Health via Telemedicine Video Visit  05/29/2019 Angela [name] [id number]  Number of [place] visits: 8 Session Start time: 10:32 AM   Session End time: 11:03 AM  Total time: 31 minutes  Referring Provider: Hoyt Koch, NP Type of Visit: Video Patient/Family location: home [facility] Provider location: Remote home office All persons participating in visit: Patient and bhc  Confirmed patient's address: Yes  Confirmed patient's phone number: Yes  Any changes to demographics: No   Confirmed patient's insurance: Yes  Any changes to patient's insurance: No   Discussed confidentiality: Yes   I connected with Angela [name] and/or [place] patient by a video enabled telemedicine application and verified that I am speaking with the correct person using two identifiers.     I discussed the limitations of evaluation and management by telemedicine and the availability of in person appointments.  I discussed that the purpose of this visit is to provide behavioral health care while limiting exposure to the novel coronavirus.   Discussed there is a possibility of technology failure and discussed alternative modes of communication if that failure occurs.  I discussed that engaging in this video visit, they consent to the provision of behavioral healthcare and the services will be billed under their insurance.  Patient and/or legal guardian expressed understanding and consented to video visit: Yes   PRESENTING CONCERNS: Patient and/or family reports the following symptoms/concerns: Eating disorder Duration of problem: Ongoing, years; Severity of problem: moderate  STRENGTHS (Protective Factors/Coping Skills): Smart, kind, willing to meet/engage  GOALS ADDRESSED: Patient will: 1.  Reduce symptoms of: behavior concerns  2.  Increase knowledge and/or ability of: healthy habits and self-management skills  3.  Demonstrate ability to:  Increase healthy adjustment to current life circumstances  INTERVENTIONS: Interventions utilized:  Solution-Focused Strategies, Behavioral Activation, Supportive Counseling and Psychoeducation and/or Health Education Standardized Assessments completed: Not Needed  ASSESSMENT: Plan at last visit: Continue with meditation, arguing negative thoughts, self-care.  Started Prozac-nauseau, no appetite, had to force self to eat. Had a job interview with [employer] in [place]. Denies anxiety.  Plan for this visit: Celebrate accomplishments, think of future goals and talk to self about reasons for maintaining plan.  PLAN: 1. Follow up with behavioral health clinician on : 6/30 2. Behavioral recommendations: See above 3. Referral(s): [place] (In Clinic)  I discussed the assessment and treatment plan with the patient and/or parent/guardian. They were provided an opportunity to ask questions and all were answered. They agreed with the plan and demonstrated an understanding of the instructions.   They were advised to call back or seek an in-person evaluation if the symptoms worsen or if the condition fails to improve as anticipated.  [name]

## 2019-05-29 NOTE — Progress Notes (Signed)
Appointment start time: 9:00 Appointment end time: 10:00  Patient was seen on 05/29/2019 for nutrition counseling pertaining to disordered eating  Primary care provider: Dr. Denyse Amass Therapist: Larene Beach at adolescent medicine  ROI: 05/08/2019 Any other medical team members: adolescent Parents: none   Assessment  Pt states her eating wasn't great this past week. Reports prozac was making her nauseous. Reports drinking a lot of milk, coffee, and Ensure. Reports she stopped taking it yesterday and feels better this morning. Pt states she was placed on bed rest and has not been participating in physical activity. States she tried another fun food this week-rice krispy treats. 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 Reports being interested in applying for job at [employer].   Still drinking 2-3 Ensures/day. States she is not doing anything to control weight gain.   Previous appts: Wants to be less dependant on Ensure and replace with food. Pt states she likes almond milk whip cream; does not prefer ice cream, prefers sherbet, sorbet, and smoothies. Pt states she likes yoga, to run long distances, nanny, babysit, and cook. Pt states she used to swim in high school; year-round swimming. Pt states she likes almond milk taste rather than regular milk taste. Will have Lactaid when having dairy products such as yogurt. Not a sweets person, prefers savory. Planning to attend [employer] in the fall.    Growth Metrics:   Median BMI for age: 37.5 BMI today: 17.81 % median today:  83% Previous growth data: weight/age  ~50th %; height/age at 25th %; BMI/age N/A Goal rate of weight gain:  0.5-1.0/week  Eating history: Length of time: Jan-Feb 2019 Previous treatments: once at [facility] for 3 days Goals for RD meetings: improve menstrual period, cold tolerance, normalize food  Weight history:   Current weight: 98.4 lbs (maintained since last  week appt 98 on 06/17) Highest weight: 145   Lowest weight: 85 Most consistent weight: 105  What would you like to weigh: N/A How has weight changed in the past year: loss of 50 lbs  Medical Information:  Changes in hair, skin, nails since ED started: no Chewing/swallowing difficulties: no  Reflux or heartburn: no Trouble with teeth: no LMP without the use of hormones: Dec 2018, some spotting since 05/06    Weight at that point: 145  Effect of exercise on menses: decreased cycle  Constipation, diarrhea: no, has BM 1-2x daily  Dizziness/lightheadedness: no Headaches/body aches: no Heart racing/chest pain: no Mood: good Sleep: pretty good, sleeps 7-8 hrs/night Focus/concentration: good Cold intolerance: yes, improving Vision changes: no  Mental health diagnosis: AN   Dietary assessment: A typical day consists of 3 meals and 3 snacks  Safe foods include: eggs, chicken, [place], ham, fish, seafood, roast beef, all vegetables, all fruit, sweet potatoes, butternut squash, yogurt, soy milk, almond milk, cheerios   Avoided foods include: gluten, dairy  24 hour recall:  B: 3 eggs + cheerios + almond butter + apple + 2 c coffee creamer + Ensure + water S: almond butter + apple + banana  L: yellow squash + sweet potato + coconut flakes + spinach + tuna pack + water + Ensure + chocolate whole Fair Life  S: cottage cheese + blueberries + almonds D: spinach + Ensure + 4 eggs + zucchini + sweet potato + balsamic vinaigrette + water + herbal tea  S: frozen fruit + almonds + chocolate almond milk  Beverage: coffee, chocolate  almond milk, water, tea, Ensure, cranberry juice  Physical activity: swimming 2x 20-30 min, walking 2x 60 min, hiking once  What Methods Do You Use To Control Your Weight (Compensatory behaviors)?           Restricting (calories, fat, carbs)  Exercise - running  Estimated energy intake: ~3000 kcal  Estimated energy needs: 2400 kcal 300 g CHO 120 g pro 80 g  fat  Nutrition Diagnosis: NI-1.4 Inadequate energy intake As related to disordered eating.  As evidenced by weight loss and amenorrhea.  Intervention/Goals: Discussed components of meals and making sure we're having 1/2 plate carbohydrates at each meal. Also discussed ways to have condiments and add gravy/sauces to increase nutritional intake.  Goals: - Aim to have at least 1/2 plate of carbohydrates/starch at each meal.  - Try adding sauces and gravy such as barbecue sauce, butter, etc.   Meal plan:    3 meals    3 snacks  Monitoring and Evaluation: Patient will follow up in 1 week.

## 2019-05-29 NOTE — Patient Instructions (Signed)
-   Aim to have at least 1/2 plate of carbohydrates/starch at each meal.   - Try adding sauces and gravy such as barbecue sauce, butter, etc.

## 2019-06-03 ENCOUNTER — Encounter: Payer: Self-pay | Admitting: Family

## 2019-06-04 ENCOUNTER — Ambulatory Visit: Payer: Self-pay | Admitting: Family

## 2019-06-05 ENCOUNTER — Ambulatory Visit: Payer: Self-pay | Admitting: Licensed Clinical Social Worker

## 2019-06-05 NOTE — BH Specialist Note (Deleted)
Integrated Behavioral Health via Telemedicine Video Visit  06/05/2019 Angela [name] [id number]  Number of [place] visits: 9 (CCA completed on 04/03/2019) Session Start time: ***  Session End time: *** Total time: {IBH Total SWFU:[id number]}  Referring Provider: Hoyt Koch, NP Type of Visit: Video Patient/Family location: Home [facility] Provider location: Remote home office All persons participating in visit: Patient and [facility]  Confirmed patient's address: Yes  Confirmed patient's phone number: Yes  Any changes to demographics: No   Confirmed patient's insurance: Yes  Any changes to patient's insurance: No   Discussed confidentiality: Yes   I connected with Angela [name] and/or [place] patient by a video enabled telemedicine application and verified that I am speaking with the correct person using two identifiers.     I discussed the limitations of evaluation and management by telemedicine and the availability of in person appointments.  I discussed that the purpose of this visit is to provide behavioral health care while limiting exposure to the novel coronavirus.   Discussed there is a possibility of technology failure and discussed alternative modes of communication if that failure occurs.  I discussed that engaging in this video visit, they consent to the provision of behavioral healthcare and the services will be billed under their insurance.  Patient and/or legal guardian expressed understanding and consented to video visit: Yes   PRESENTING CONCERNS: Patient and/or family reports the following symptoms/concerns: disordered eating Duration of problem: Ongoing, years; Severity of problem: moderate  STRENGTHS (Protective Factors/Coping Skills): Willing to participate, open  GOALS ADDRESSED: Patient will: 1.  Reduce symptoms of: behavior concerns  2.  Increase knowledge and/or ability of: healthy habits and self-management skills  3.   Demonstrate ability to: Increase healthy adjustment to current life circumstances  INTERVENTIONS: Interventions utilized:  Solution-Focused Strategies, Behavioral Activation, Supportive Counseling and Psychoeducation and/or Health Education Standardized Assessments completed: Not Needed  ASSESSMENT: Patient currently experiencing ***.   Patient may benefit from ***.  Plan from last visit: Think of future goals Celebrate accomplishments Talk to self about reasons for maintaining plan and care for self  PLAN: 1. Follow up with behavioral health clinician on : *** 2. Behavioral recommendations: See above 3. Referral(s): [place] (In Clinic)  I discussed the assessment and treatment plan with the patient and/or parent/guardian. They were provided an opportunity to ask questions and all were answered. They agreed with the plan and demonstrated an understanding of the instructions.   They were advised to call back or seek an in-person evaluation if the symptoms worsen or if the condition fails to improve as anticipated.  [name]

## 2019-06-05 NOTE — BH Specialist Note (Signed)
Integrated Behavioral Health via Telemedicine Video Visit  06/05/2019 Angela [name] [id number]  Number of [place] visits: 9th Session Start time: 2:30P  Session End time: 3:00 PM  Total time: 30 minutes  Referring Provider: Hoyt Koch, NP Type of Visit: Video Patient/Family location: Home [facility] Provider location: Remote home office All persons participating in visit: Patient and [facility]  Confirmed patient's address: Yes  Confirmed patient's phone number: Yes  Any changes to demographics: No   Confirmed patient's insurance: Yes  Any changes to patient's insurance: No   Discussed confidentiality: Yes   I connected with Angela [name] and/or Angela [name] patient by a video enabled telemedicine application and verified that I am speaking with the correct person using two identifiers.     I discussed the limitations of evaluation and management by telemedicine and the availability of in person appointments.  I discussed that the purpose of this visit is to provide behavioral health care while limiting exposure to the novel coronavirus.   Discussed there is a possibility of technology failure and discussed alternative modes of communication if that failure occurs.  I discussed that engaging in this video visit, they consent to the provision of behavioral healthcare and the services will be billed under their insurance.  Patient and/or legal guardian expressed understanding and consented to video visit: Yes   PRESENTING CONCERNS: Patient and/or family reports the following symptoms/concerns: Eating disorder, stress Duration of problem: Ongoing, years; Severity of problem: moderate  STRENGTHS (Protective Factors/Coping Skills): Willing to meet, basic needs met  GOALS ADDRESSED: Patient will: 1.  Reduce symptoms of: behavior concerns  2.  Increase knowledge and/or ability of: healthy habits and self-management skills  3.  Demonstrate ability to:  Increase healthy adjustment to current life circumstances  INTERVENTIONS: Interventions utilized:  Solution-Focused Strategies, Behavioral Activation, Brief CBT, Supportive Counseling and Psychoeducation and/or Health Education Standardized Assessments completed: Not Needed  ASSESSMENT: Got job at [employer], staying busy, commission. Goal: 2x/week sit down and have a distraction-free lunch. Pacing at lunch - ask Angela [name].  Feels like she hasn't been as hungry, but encouraging herself to eat regardless.   Tried some new coffee creamer and liked it. Nut pods at [employer].   Swimming - haven't gone back yet, kinda bummed, but now her social need is getting met.  Consider journaling  1/2 marathon in [place] in November. -[place]: 1. Follow up with behavioral health clinician on : 7/15 2. Behavioral recommendations: See above 3. Referral(s): [place] (In Clinic)  I discussed the assessment and treatment plan with the patient and/or parent/guardian. They were provided an opportunity to ask questions and all were answered. They agreed with the plan and demonstrated an understanding of the instructions.   They were advised to call back or seek an in-person evaluation if the symptoms worsen or if the condition fails to improve as anticipated.  Angela [name]

## 2019-06-06 ENCOUNTER — Telehealth: Payer: Self-pay

## 2019-06-06 ENCOUNTER — Ambulatory Visit (INDEPENDENT_AMBULATORY_CARE_PROVIDER_SITE_OTHER): Payer: Self-pay | Admitting: Licensed Clinical Social Worker

## 2019-06-06 DIAGNOSIS — F4329 Adjustment disorder with other symptoms: Secondary | ICD-10-CM

## 2019-06-06 NOTE — Progress Notes (Signed)
Patient was scheduled on this day and the next in error. No charge. Closing encounter for admin reasons.

## 2019-06-06 NOTE — Telephone Encounter (Signed)

## 2019-06-07 ENCOUNTER — Inpatient Hospital Stay (HOSPITAL_COMMUNITY)
Admission: AD | Admit: 2019-06-07 | Discharge: 2019-06-12 | DRG: 883 | Disposition: A | Discharge status: Home / Self Care | Payer: No Typology Code available for payment source | Source: Ambulatory Visit | Attending: Pediatrics | Admitting: Pediatrics

## 2019-06-07 ENCOUNTER — Other Ambulatory Visit: Payer: Self-pay

## 2019-06-07 ENCOUNTER — Encounter: Payer: Self-pay | Admitting: Family

## 2019-06-07 ENCOUNTER — Encounter: Payer: Self-pay | Admitting: Registered"

## 2019-06-07 ENCOUNTER — Inpatient Hospital Stay (HOSPITAL_COMMUNITY): Payer: No Typology Code available for payment source

## 2019-06-07 ENCOUNTER — Encounter: Payer: No Typology Code available for payment source | Attending: Family | Admitting: Registered"

## 2019-06-07 ENCOUNTER — Ambulatory Visit (INDEPENDENT_AMBULATORY_CARE_PROVIDER_SITE_OTHER): Payer: Self-pay | Admitting: Family

## 2019-06-07 ENCOUNTER — Encounter (HOSPITAL_COMMUNITY): Payer: Self-pay | Admitting: Emergency Medicine

## 2019-06-07 VITALS — BP 98/66 | HR 62 | Ht 62.56 in | Wt 96.2 lb

## 2019-06-07 DIAGNOSIS — Z1159 Encounter for screening for other viral diseases: Secondary | ICD-10-CM

## 2019-06-07 DIAGNOSIS — E232 Diabetes insipidus: Secondary | ICD-10-CM

## 2019-06-07 DIAGNOSIS — F50019 Anorexia nervosa, restricting type, unspecified: Secondary | ICD-10-CM

## 2019-06-07 DIAGNOSIS — R358 Other polyuria: Secondary | ICD-10-CM | POA: Diagnosis present

## 2019-06-07 DIAGNOSIS — Z91018 Allergy to other foods: Secondary | ICD-10-CM | POA: Diagnosis not present

## 2019-06-07 DIAGNOSIS — Z68.41 Body mass index (BMI) pediatric, less than 5th percentile for age: Secondary | ICD-10-CM

## 2019-06-07 DIAGNOSIS — B079 Viral wart, unspecified: Secondary | ICD-10-CM | POA: Diagnosis present

## 2019-06-07 DIAGNOSIS — N912 Amenorrhea, unspecified: Secondary | ICD-10-CM | POA: Diagnosis present

## 2019-06-07 DIAGNOSIS — F5001 Anorexia nervosa, restricting type: Secondary | ICD-10-CM | POA: Insufficient documentation

## 2019-06-07 DIAGNOSIS — Z79899 Other long term (current) drug therapy: Secondary | ICD-10-CM | POA: Diagnosis not present

## 2019-06-07 DIAGNOSIS — F5000 Anorexia nervosa, unspecified: Secondary | ICD-10-CM | POA: Diagnosis present

## 2019-06-07 DIAGNOSIS — E739 Lactose intolerance, unspecified: Secondary | ICD-10-CM | POA: Diagnosis present

## 2019-06-07 DIAGNOSIS — R631 Polydipsia: Secondary | ICD-10-CM | POA: Diagnosis present

## 2019-06-07 DIAGNOSIS — Z1389 Encounter for screening for other disorder: Secondary | ICD-10-CM

## 2019-06-07 DIAGNOSIS — R001 Bradycardia, unspecified: Secondary | ICD-10-CM

## 2019-06-07 DIAGNOSIS — Z713 Dietary counseling and surveillance: Secondary | ICD-10-CM | POA: Insufficient documentation

## 2019-06-07 DIAGNOSIS — E440 Moderate protein-calorie malnutrition: Secondary | ICD-10-CM

## 2019-06-07 DIAGNOSIS — F509 Eating disorder, unspecified: Secondary | ICD-10-CM | POA: Diagnosis present

## 2019-06-07 DIAGNOSIS — R3589 Other polyuria: Secondary | ICD-10-CM

## 2019-06-07 LAB — CHOLESTEROL, TOTAL: Cholesterol: 142 mg/dL (ref 0–169)

## 2019-06-07 LAB — POCT URINALYSIS DIPSTICK
Bilirubin, UA: NEGATIVE
Blood, UA: NEGATIVE
Glucose, UA: NEGATIVE
Ketones, UA: NEGATIVE
Leukocytes, UA: NEGATIVE
Nitrite, UA: NEGATIVE
Protein, UA: NEGATIVE
Spec Grav, UA: <=1.005 — AB (ref 1.010–1.025)
Urobilinogen, UA: NEGATIVE E.U./dL — AB
pH, UA: 8.5 — AB (ref 5.0–8.0)

## 2019-06-07 LAB — COMPREHENSIVE METABOLIC PANEL
ALT: 32 U/L (ref 0–44)
AST: 40 U/L (ref 15–41)
Albumin: 4.7 g/dL (ref 3.5–5.0)
Alkaline Phosphatase: 45 U/L (ref 38–126)
Anion gap: 8 (ref 5–15)
BUN: 28 mg/dL — ABNORMAL HIGH (ref 6–20)
CO2: 27 mmol/L (ref 22–32)
Calcium: 9.5 mg/dL (ref 8.9–10.3)
Chloride: 102 mmol/L (ref 98–111)
Creatinine, Ser: 0.67 mg/dL (ref 0.44–1.00)
GFR calc Af Amer: >60 mL/min (ref >60)
GFR calc non Af Amer: >60 mL/min (ref >60)
Glucose, Bld: 81 mg/dL (ref 70–99)
Potassium: 4 mmol/L (ref 3.5–5.1)
Sodium: 137 mmol/L (ref 135–145)
Total Bilirubin: 0.9 mg/dL (ref 0.3–1.2)
Total Protein: 6.5 g/dL (ref 6.5–8.1)

## 2019-06-07 LAB — CBC WITH DIFFERENTIAL/PLATELET
Abs Immature Granulocytes: 0.02 10*3/uL (ref 0.00–0.07)
Basophils Absolute: 0 10*3/uL (ref 0.0–0.1)
Basophils Relative: 1 %
Eosinophils Absolute: 0.2 10*3/uL (ref 0.0–0.5)
Eosinophils Relative: 4 %
HCT: 35.6 % — ABNORMAL LOW (ref 36.0–46.0)
Hemoglobin: 12.4 g/dL (ref 12.0–15.0)
Immature Granulocytes: 1 %
Lymphocytes Relative: 24 %
Lymphs Abs: 1 10*3/uL (ref 0.7–4.0)
MCH: 35.9 pg — ABNORMAL HIGH (ref 26.0–34.0)
MCHC: 34.8 g/dL (ref 30.0–36.0)
MCV: 103.2 fL — ABNORMAL HIGH (ref 80.0–100.0)
Monocytes Absolute: 0.4 10*3/uL (ref 0.1–1.0)
Monocytes Relative: 10 %
Neutro Abs: 2.4 10*3/uL (ref 1.7–7.7)
Neutrophils Relative %: 60 %
Platelets: 201 10*3/uL (ref 150–400)
RBC: 3.45 MIL/uL — ABNORMAL LOW (ref 3.87–5.11)
RDW: 10.8 % — ABNORMAL LOW (ref 11.5–15.5)
WBC: 4 10*3/uL (ref 4.0–10.5)
nRBC: 0 % (ref 0.0–0.2)

## 2019-06-07 LAB — SEDIMENTATION RATE: Sed Rate: 0 mm/hr (ref 0–22)

## 2019-06-07 LAB — TSH: TSH: 0.893 u[IU]/mL (ref 0.350–4.500)

## 2019-06-07 LAB — T4, FREE: Free T4: 0.7 ng/dL (ref 0.61–1.12)

## 2019-06-07 LAB — AMYLASE: Amylase: 277 U/L — ABNORMAL HIGH (ref 28–100)

## 2019-06-07 LAB — GAMMA GT: GGT: 10 U/L (ref 7–50)

## 2019-06-07 LAB — MAGNESIUM: Magnesium: 2.5 mg/dL — ABNORMAL HIGH (ref 1.7–2.4)

## 2019-06-07 LAB — LIPASE, BLOOD: Lipase: 35 U/L (ref 11–51)

## 2019-06-07 LAB — PHOSPHORUS: Phosphorus: 4 mg/dL (ref 2.5–4.6)

## 2019-06-07 LAB — URIC ACID: Uric Acid, Serum: 2.9 mg/dL (ref 2.5–7.1)

## 2019-06-07 LAB — TRIGLYCERIDES: Triglycerides: 36 mg/dL (ref <150)

## 2019-06-07 LAB — SARS CORONAVIRUS 2 BY RT PCR (HOSPITAL ORDER, PERFORMED IN ~~LOC~~ HOSPITAL LAB): SARS Coronavirus 2: NEGATIVE

## 2019-06-07 MED ORDER — ADULT MULTIVITAMIN W/MINERALS CH
1.0000 | ORAL_TABLET | Freq: Every day | ORAL | Status: DC
  Administered 2019-06-08 – 2019-06-12 (×5): 1 via ORAL
  Filled 2019-06-07 (×6): qty 1

## 2019-06-07 MED ORDER — ENSURE ENLIVE PO LIQD
237.0000 mL | Freq: Three times a day (TID) | ORAL | Status: DC | PRN
  Filled 2019-06-07 (×6): qty 237

## 2019-06-07 NOTE — Progress Notes (Signed)
Mirel admitted to 56m10. Alert and oriented. Dr. Hulen Skains here and  Discussed eating disorder plan .  Lunch and dinner came and she ate 100% of both without problems.  Sitter at bedside. No complaints at this time.

## 2019-06-07 NOTE — Patient Instructions (Signed)
-   Try to add pudding this week.   - Keep up the great work adhering to 2 days/week exercise and eating throughout the day.

## 2019-06-07 NOTE — Progress Notes (Addendum)
Nutrition Brief Note  List of food items RD has ordered at meals. RN and care team may modify meal tray if food items upon arrival do not match meal RD has ordered.  Lunch: Soy milk 1 serving of grapes 1 garden salad with balsamic vinaigrette dressing 3 hard boiled eggs 1 serving of Rice Krispies Cereal 1 cup decaf coffee 10 oz bottle water  Dinner to arrive at 5:30 pm: Soy milk 1 orange 1 fresh fruit cup 1 garden salad with balsamic vinaigrette dressing 2 slices of deli [place] meat with no bread 1 sweet potato 1 cup decaf coffee 10 oz bottle water  Breakfast (7/3) to arrive at 7:00 am: Soy milk 1 apple 1 banana 3 hard boiled eggs 1 cup decaf coffee 10 oz bottle water  Full nutrition assessment note to follow.   Corrin Parker, MS, RD, LDN Pager # [phone] After hours/ weekend pager # [phone]

## 2019-06-07 NOTE — Progress Notes (Signed)
History was provided by the patient.  Angela [name] is a 18 y.o. female who is here for Anorexia nervosa, restricting type.   PCP confirmed? Yes.   Truddie Coco, MD  HPI:   -she is tearful in the room, knows her heart rate is low  -reviewed what has been difficult this week - she started a job on Sunday at [employer] in town; did one class, which she thinks was the job interview because then she was told when to come in to work. -she admits to having walked daily instead of the twice weekly 30 minute walks.  -per Judeth Porch and confirmed with [place] today, she wanted to have a goal of a half marathon   -as we discuss that her low heart rate meets criteria for medical monitoring inpatient, she begins to cry. Says her mom is going to be upset with her, than mom felt she should go to treatment after her last hospitalization. When asked about the possibility of Marylu going directly to a residential program from this medical stabilization, Nidya says she thinks she needs it and is open to [facility] in [place].   She is safe to herself.   She stopped taking fluoxetine without adverse effects. She is open to another SSRI if it will help and not make her nausea/tired.  She continues zyprexa 2.5 mg nightly   Review of Systems  Constitutional: Positive for weight loss. Negative for chills, fever and malaise/fatigue.  HENT: Negative for sore throat.   Eyes: Negative for blurred vision and double vision.  Respiratory: Negative for cough and shortness of breath.   Cardiovascular: Negative for chest pain and palpitations.  Gastrointestinal: Negative for abdominal pain and heartburn.  Genitourinary: Negative for dysuria.  Musculoskeletal: Negative for joint pain and myalgias.  Skin: Negative for itching and rash.  Neurological: Positive for headaches. Negative for dizziness and tremors.  Psychiatric/Behavioral: Negative for suicidal ideas. The patient is nervous/anxious.       Patient Active Problem List   Diagnosis Date Noted  . Macrocytosis   . Hypophosphatemia 08/09/2018  . Eating disorder 08/08/2018  . Moderate protein-calorie malnutrition ([place]) 08/08/2018  . Chronic bilateral low back pain without sciatica 02/05/2018    Current Outpatient Medications on File Prior to Visit  Medication Sig Dispense Refill  . feeding supplement, ENSURE ENLIVE, (ENSURE ENLIVE) LIQD Take 237 mLs by mouth as needed (if meal goal not met). 237 mL 12  . hydrOXYzine (ATARAX/VISTARIL) 10 MG tablet Take 1 tablet (10 mg total) by mouth 3 (three) times daily as needed. 30 tablet 0  . Multiple Vitamin (MULTIVITAMIN WITH MINERALS) TABS tablet Take 1 tablet by mouth daily. 30 tablet 0  . OLANZapine (ZYPREXA) 2.5 MG tablet Take 1 tablet (2.5 mg total) by mouth daily. 90 tablet 0   No current facility-administered medications on file prior to visit.     No Known Allergies  Physical Exam:    Vitals:   06/07/19 1010 06/07/19 1027  BP: 101/66 98/66  Pulse: (!) 44 62  Weight: 96 lb 3.2 oz (43.6 kg)   Height: 5' 2.56" (1.589 m)    Wt Readings from Last 3 Encounters:  06/07/19 96 lb 3.2 oz (43.6 kg) (2 %, Z= -2.12)*  05/28/19 98 lb 6.4 oz (44.6 kg) (3 %, Z= -1.90)*  05/23/19 98 lb (44.5 kg) (3 %, Z= -1.93)*   * Growth percentiles are based on CDC (Girls, 2-20 Years) data.    Blood pressure percentiles are not  available for patients who are 18 years or older. No LMP recorded.  Physical Exam Vitals signs reviewed.  Constitutional:      General: She is not in acute distress. HENT:     Head: Normocephalic.  Eyes:     Extraocular Movements: Extraocular movements intact.     Pupils: Pupils are equal, round, and reactive to light.  Neck:     Musculoskeletal: Normal range of motion.  Cardiovascular:     Rate and Rhythm: Regular rhythm. Bradycardia present.     Heart sounds: No murmur.  Pulmonary:     Effort: Pulmonary effort is normal.  Abdominal:     General: Abdomen  is flat.  Musculoskeletal: Normal range of motion.        General: No swelling.  Lymphadenopathy:     Cervical: No cervical adenopathy.  Skin:    General: Skin is dry.     Comments: Carotenemia noted on palms   Neurological:     General: No focal deficit present.     Mental Status: She is alert and oriented to person, place, and time.  Psychiatric:        Mood and Affect: Mood is anxious. Affect is tearful.      Assessment/Plan: 1. Anorexia nervosa, restricting type -meets criteria for inpatient monitoring and Eating Disorder Protocol.  -Report called to Peds Unit, [place] confirmed admission. Mida is stable for transport. Claudette Laws, CMA assisted her to admissions without incident.  -discussed that I would let team know that we want to proceed with an admission to [facility] immediately pending her medical stability and acceptance into their program.   2. Bradycardia -as above, continue with monitoring per protocol   3. Moderate protein-calorie malnutrition ([place]) -down 2 lbs since last OV; she is aware of this -consider start of sertraline if [place] is agreeable.   4. Screening for genitourinary condition Urinalysis negative for infection - POCT Urinalysis Dipstick  Chrys Racer had pending labs for POI work-up. Will discuss further with team to complete this while inpatient. Labs are future orders.

## 2019-06-07 NOTE — H&P (Addendum)
Pediatric Teaching Program H&P [address], [place] [address] Phone: [phone] Fax: [phone]   Patient Details  Name: Angela [name] MRN: [id number] DOB: 21-Jan-2001 Age: 18 y.o.          Gender: female  Chief Complaint  Bradycardia and weight loss  History of the Present Illness  Angela [name] is a 18 y.o. female with a PMHx of Anorexia Nervosa who presents with bradycardia and recent 2 lb weight loss. Angela [name] presented for an adolescent medicine visit with Angela Pinto, NP this AM where she was found to be bradycardic with a HR at 44. The nurse practitioner also noted a 2 lb weight loss. Patient reports that she did not feel dizzy, fatigue, has had no LOC, palpitations, NVD, headaches, anxiety, nervousness, blood in stool,  or cold intolerance.  She endorses feeling down since being told she would need to be admitted today. She reports eating her normal foods but has been exercising 30 minutes - 2 hours daily (walking, biking, treadmill).  She has maintained her caffeine intake to 1 Venti cup of coffee since September.  She enjoys eating egg whites. Angela [name] has had 4 cycles in her lifetime since the 9th grade; last was Dec 2019. She will be attending college in [place] this year.    Review of Systems   General: Weight Loss. Negative for chills, fever. Neuro: Negative for headache, dizziness, or syncope. HEENT: Negative for cough or sore throat. CV: Negative for palpitations Respiratory: Negative for SOB GI/GU: Constipation, no melena or hematuria. Irregular menstrual cycle Endo: No hot or cold intolerance Psych: No SI/HI. No anxiety. Slightly upset because she had to come to the hospital.  Past Birth, Medical & Surgical History  Anorexia Nervosa, Restrictive Type  Developmental History  Well-developed  Diet History  Follows an outpatient nutrition management plan under Angela [name], RD Favorite foods are egg whites,  fruits, and vegetables Family History  Hyperlipidemia in the mother; Bulimia in the older sister  Social History  Lives with mother and just started a new job at [employer]. Exercises everyday by running, walking, or riding her bike between 30 minutes to 2 hours. Does not use drugs, tobacco, or alcohol. Drinks 1 cup of coffee per day. Not sexually active.   Primary Care Provider  Dr. Derrill Center   Home Medications   Current Meds  Medication Sig  . Multiple Vitamin (MULTIVITAMIN WITH MINERALS) TABS tablet Take 1 tablet by mouth daily.  Marland Kitchen ZYPREXA 2.5 MG tablet Take 2.5 mg by mouth at bedtime.    Allergies   Allergies  Allergen Reactions  . Gluten Meal Other (See Comments)    Stomach Pain  . Lactose Intolerance (Gi) Diarrhea    Immunizations  UTD with vaccinations.   Exam  BP (!) 98/59 (BP Location: Right Arm)   Pulse (!) 58   Temp 97.8 F (36.6 C) (Oral)   Resp 17   Ht 5' 2.56" (1.589 m)   Wt 43 kg   SpO2 97%   BMI 17.03 kg/m   Weight: 43 kg   1 %ile (Z= -2.26) based on CDC (Girls, 2-20 Years) weight-for-age data using vitals from 06/07/2019.  General: Well-appearing. Very friendly, conversational, and cooperative. Eyes: no conjunctival hemorrhage  HENT: no parotid gland enlargement noted Heart: RRR. Normal S1 and S2. Bradycardic. 1+ Carotid pulses. 2+ radial and dorsalis pedis pulses. Chest: Lungs CTA b/l. No wheezes, crackles, or rhonchi.  Abdomen: Soft and non distended. Normal bowel sounds. No tenderness,  rebounding, or guarding. Extremities: Full ROM in upper and lower extremities. Decreased muscle mass.  Skin: Warm and dry.  Wart-like growth on right anterior thigh without erythema or fluctuance . No ecchymosis, rashes, or other lesions. Cap refill brisk. No abrasions noted to knuckles. No edema.  Selected Labs & Studies   Lab Orders - Abnormal's and pending noted      SARS Coronavirus 2 - Negative      CBC with Differential/Platelet -  RBC 3.45 decreased, Hct  35.6 slightly decreased, MCV 103.2 elevated, MCH 35.9 elevated, RDW 10.8 decreased,      Sedimentation rate      Comprehensive metabolic panel - BUN 28, elevated      Phosphorus      Magnesium 2.5, slightly elevated     Gamma GT     Uric acid      Triglycerides     Cholesterol, total     Amylase -  277 elevated      Lipase, blood     TSH     T4, free      T3 - pending      Rapid urine drug screen - pending     Pregnancy, urine - pending     Urinalysis, Routine w reflex microscopic- pending        Assessment  Principal Problem:   Bradycardia Active Problems:   Eating disorder   Moderate protein-calorie malnutrition (HCC)   [name] is a 18 y.o. female with a PMHx of Anorexia Nervosa admitted for bradycardia and weight loss. Angela [name] has had several hospitalizations before due to her eating disorder with the last being in April 2019. She seemed to fully comprehend the reason she was being hospitalized. She was cooperative and able to recite and understand the restrictions she has been placed on while being in the hospital. She is well appearing and does not display symptoms of her bradycardia such as dizziness or syncope. Will need to continue to closely monitor labs and vital signs for refeeding syndrome. Patient states that she understands she may enter a long term facility and is agreeable with that plan.  Patient awaits medical clearance and placement.   Plan   Symptomatic Eating Disorder  - F/U on labs: T3, T4, TSH, phosphorous, magnesium, BMP, urine pregnancy, urine drug screen - Will check BMP tomorrow to eval for refeeding syndrome.  Since this is not the patient's first admission and she has moderate malnutrition, can likely forego daily BMP - 1:1 sitter  - Bed rest with bathroom privileges - Cardiac monitoring - Daily weights - Q4 BP - Daily orthostatic vital signs - Q4 vital signs - daily weights - strict I/O - qdaily orthostatics - Obtain baseline EKG    FENGI: - Ensure 237 ml tid with meals, follow eating disorder protocol - Multivitamin 1 tablet - Regular diet with thin liquids (1600 kcal)  Access:none  Dispo: Once taking all appropriate nutrition, no symptomatic orthostasis, HR 45 or above during day and HR 40 or above while sleeping then will discuss discharge plans with adolescent med  Interpreter present: no   Donn PieriniGrace E [place], Medical Student 06/07/2019, 1:46 PM  RESIDENT ATTESTATION OF STUDENT NOTE   I have seen and examined this patient.   I have discussed the findings and exam with the medical student and agree with the above note, which I have edited appropriately. I helped develop the management plan that is described in the student's note, and I agree with the content.  Katha CabalVondra Brimage, DO 06/07/2019, 5:26 PM   I personally saw and evaluated the patient, and participated in the management and treatment plan as documented in the resident's note.  Maryanna ShapeAngela H Nichele Slawson, MD 06/08/2019 8:27 AM

## 2019-06-07 NOTE — Progress Notes (Signed)
   06/07/19 1500  Clinical Encounter Type  Visited With Patient;Health care provider  Visit Type Initial;Social support  Spiritual Encounters  Spiritual Needs Emotional  Stress Factors  Patient Stress Factors Health changes   Familiar w/ pt from a previous hospitalization this year.  Attempted hello, but pt was only somewhat engaged as she was active on cell phone (not on a call).  Sitter present entire visit.  Also greeted her stuffed Hulan Amato the Pooh bear which she had in hand, remember from her last visit that she had it from birth.  Myra Gianotti resident, [phone]

## 2019-06-07 NOTE — Progress Notes (Signed)
INITIAL PEDIATRIC/NEONATAL NUTRITION ASSESSMENT Date: 06/07/2019   Time: 4:33 PM  RD working remotely.  Reason for Assessment: Disordered eating  ASSESSMENT: Female 18 y.o.   Admission Dx/Hx: Bradycardia 18 y.o. female with a PMHx of Anorexia Nervosa presents with bradycardia and recent 2 lb weight loss.  Weight: 43 kg(1%, z-score -2.26) Length/Ht: 5' 2.56" (158.9 cm) (25%) Body mass index is 17.03 kg/m. Plotted on CDC growth chart  Assessment of Growth: Pt meets criteria for MODERATE MALNUTRITION as evidenced by BMI for age z-score of -2.10.  Pt with a 5% weight loss over the past 22 days per weight records.   Diet/Nutrition Support: Regular diet with thin liquids.  Usual daily intake PTA:  Breakfast: 4 boiled eggs, 1 apple, 1 cup soy milk, 1 cup coffee Snack: 1 banana Lunch: chicken, carrots, grapes, 1 cup soy milk Snack: 1 apple Dinner: Oven roasted [place], squash, spinach, 1 cup soy milk Estimated caloric intake: ~1600 calories/day  Estimated Needs:  47 ml/kg 47-51 Kcal/kg 2000-2200 calories/day 1.5-2 g Protein/kg   RD contacted pt via inpatient room phone. RD has ordered meals per protocol. Pt with no questions or concerns regarding eating disorder protocol. Nutrition plan to start at 1600 kcal today. Will increase by ~200-250 kcal each day until full nutrition goal is met. Pt to meet full nutrition goal on Sunday, July 5th. Pt at risk for refeeding syndrome given malnutrition and prolonged restrictive eating. Meal completion 100% at lunch today. Ensure supplementation to be given if meal completion incomplete. RD to order MVI to ensure adequate vitamins and minerals are met. RD to follow up with pt tomorrow in person.   Will continue to monitor.   Urine Output: 1600 ml  Related Meds: MVI  Labs reviewed. Serum amylase elevated at 277.   IVF:   NUTRITION DIAGNOSIS: -Malnutrition (NI-5.2) (moderate, chronic). related to disordered restrictive eating as evidenced by  BMI for age z-score of -2.10.  Status: Ongoing  MONITORING/EVALUATION(Goals): PO intake Weight trends; goal of 100-200 gram gain/day Labs I/O's  INTERVENTION:   Provide Ensure Enlive po if meal completion incomplete.    Provide multivitamin once daily.    Monitor magnesium, potassium, and phosphorus daily, MD to replete as needed, as pt is at risk for refeeding syndrome given malnutrition and prolonged restrictive eating.  Patient to meet full nutrition goal on Sunday, July 5th.  Corrin Parker, MS, RD, LDN Pager # [phone] After hours/ weekend pager # [phone]

## 2019-06-07 NOTE — Progress Notes (Signed)
Appointment start time: 9:00 Appointment end time: 9:55  Patient was seen on 06/07/2019 for nutrition counseling pertaining to disordered eating  Primary care provider: Dr. 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 Glad to be able to have something to do. Works about 4-4.5 hours. States she is allowed to take free classes if she wants and expresses interest in taking low-impact version of class. Pt also expresses interest in training for 1/2 marathon in Nov.   States she has tried something new: zucchini with tomato sauce and parmesan cheese  States she is no longer taking Prozac, replaced with zyprexa.   Previous appts: Still drinking 2-3 Ensures/day. States she is not doing anything to control weight gain. Wants to be less dependant on Ensure and replace with food. Pt states she likes almond milk whip cream; does not prefer ice cream, prefers sherbet, sorbet, and smoothies. Pt states she likes yoga, to run long distances, nanny, babysit, and cook. Pt states she used to swim in high school; year-round swimming. Pt states she likes almond milk taste rather than regular milk taste. Will have Lactaid when having dairy products such as yogurt. Not a sweets person, prefers savory. Planning to attend [employer] in the fall.    Growth Metrics:   Median BMI for age: 6321.5 BMI today: 17.81 % median today:  83% Previous growth data: weight/age  ~50th %; height/age at 25th %; BMI/age N/A Goal rate of weight gain:  0.5-1.0/week  Eating history: Length of time: Jan-Feb 2019 Previous [facility] Goals for RD meetings: improve menstrual period, cold tolerance, normalize food  Weight history:   Current weight: 98.3 lbs (maintained since last week appt  98 on 06/17) Highest weight: 145   Lowest weight: 85 Most consistent weight: 105  What would you like to weigh: N/A How has weight changed in the past year: loss of 50 lbs  Medical Information:  Changes in hair, skin, nails since ED started: no Chewing/swallowing difficulties: no  Reflux or heartburn: no Trouble with teeth: no LMP without the use of hormones: Dec 2018, some spotting since 05/06    Weight at that point: 145  Effect of exercise on menses: decreased cycle  Constipation, diarrhea: no, has BM 1-2x daily  Dizziness/lightheadedness: no Headaches/body aches: no Heart racing/chest pain: no Mood: good Sleep: pretty good, sleeps 7-8 hrs/night Focus/concentration: good Cold intolerance: yes, improving Vision changes: no  Mental health diagnosis: AN   Dietary assessment: A typical day consists of 3 meals and 3 snacks  Safe foods include: eggs, chicken, [place], ham, fish, seafood, roast beef, all vegetables, all fruit, sweet potatoes, butternut squash, yogurt, soy milk, almond milk, cheerios   Avoided foods include: gluten, dairy  24 hour recall:  B: 4 eggs + cheerios + coconut + peanut butter + apple + 1 c coffee creamer + Ensure + water S: 1 packet almond butter + apple + banana + 1 c coffee + almond milk L: roasted [place] + grapes + carrots + 2 sweet potatoes + water + 1 c coffee + creamer  S: Ensure + chocolate + Lara bar  D: [place] chicken breast + sweet potato + broccoli + hummus + Ensure + 1 c coffee + water + coconut shavings  S: bowl of blueberries + chocolate  soy milk   Beverage: coffee, chocolate soy milk, water, tea, Ensure  Physical activity: walking 30 min once/week, intro to Pilates class 30 min   What Methods Do You Use To Control Your Weight (Compensatory behaviors)?           Restricting (calories, fat, carbs)  Exercise - running  Estimated energy intake: ~3000+ kcal  Estimated energy needs: 2400 kcal 300 g CHO 120 g pro 80 g  fat  Nutrition Diagnosis: NI-1.4 Inadequate energy intake As related to disordered eating.  As evidenced by weight loss and amenorrhea.  Intervention/Goals: Discussed how she's been maintaining where she in the recent weeks, how working at a gym may affect her recovery and desire to workout more. Discussed potential new food items of interest to try this week and new motivators. Answered patients' questions. Pt was in agreement with goals listed.  Goals: - Try to add pudding this week.  - Keep up the great work adhering to 2 days/week exercise and eating throughout the day.    Meal plan:    3 meals    3 snacks  Monitoring and Evaluation: Patient will follow up in 1 week.

## 2019-06-08 ENCOUNTER — Encounter: Payer: Self-pay | Admitting: Family

## 2019-06-08 LAB — URINALYSIS, ROUTINE W REFLEX MICROSCOPIC
Bilirubin Urine: NEGATIVE
Glucose, UA: NEGATIVE mg/dL
Hgb urine dipstick: NEGATIVE
Ketones, ur: NEGATIVE mg/dL
Leukocytes,Ua: NEGATIVE
Nitrite: NEGATIVE
Protein, ur: NEGATIVE mg/dL
Specific Gravity, Urine: 1.006 (ref 1.005–1.030)
pH: 7 (ref 5.0–8.0)

## 2019-06-08 LAB — T3: T3, Total: 44 ng/dL — ABNORMAL LOW (ref 71–180)

## 2019-06-08 LAB — BASIC METABOLIC PANEL
Anion gap: 7 (ref 5–15)
BUN: 21 mg/dL — ABNORMAL HIGH (ref 6–20)
CO2: 27 mmol/L (ref 22–32)
Calcium: 9.4 mg/dL (ref 8.9–10.3)
Chloride: 102 mmol/L (ref 98–111)
Creatinine, Ser: 0.61 mg/dL (ref 0.44–1.00)
GFR calc Af Amer: >60 mL/min (ref >60)
GFR calc non Af Amer: >60 mL/min (ref >60)
Glucose, Bld: 75 mg/dL (ref 70–99)
Potassium: 3.8 mmol/L (ref 3.5–5.1)
Sodium: 136 mmol/L (ref 135–145)

## 2019-06-08 LAB — MAGNESIUM: Magnesium: 2.2 mg/dL (ref 1.7–2.4)

## 2019-06-08 LAB — PHOSPHORUS: Phosphorus: 4.4 mg/dL (ref 2.5–4.6)

## 2019-06-08 MED ORDER — MELATONIN 3 MG PO TABS
3.0000 mg | ORAL_TABLET | Freq: Every evening | ORAL | Status: DC | PRN
  Administered 2019-06-09 – 2019-06-11 (×3): 3 mg via ORAL
  Filled 2019-06-08 (×4): qty 1

## 2019-06-08 NOTE — Progress Notes (Signed)
Patient awake with assessment but states, "I don't want the Melatonin at this time."  Patient aware that Melatonin is available if needed.

## 2019-06-08 NOTE — Progress Notes (Signed)
Pt has ate all her food in less than 30 minutes. Vitals stable however her heart rate has been low a few times hovering around 40-50, mainly mid afternoon.  Pt. Has requested melatonin but not on pharmacological list. May need to check for need for other sleep med tonight. Pleasant talkative and interactive with staff.

## 2019-06-08 NOTE — Progress Notes (Addendum)
CSW following to assist with placement into treatment facility. CSW has faxed over necessary documents to [facility] [phone]) and has attempted to follow up with admissions regarding status of referral. CSW awaiting return call regarding bed availability and if they have reviewed referral.   Update: CSW spoke with admissions at [facility] who confirmed they received faxed referral and reported they were sending it off for review. Per admissions, they would reach out once reviewed. CSW will continue to follow.  Elijio Miles, [place]  [facility] [employer] [phone]

## 2019-06-08 NOTE — Progress Notes (Addendum)
Nutrition Brief Note  List of food items RD has ordered at meals. RN and care team may modify meal tray if food items upon arrival do not match meal RD has ordered.  Friday 7/3: Lunch to arrive at 11:30 am: 8 ounces of Soy milk 2 servings of fresh fruit cup 2 garden side salads 1 packet of balsamic vinaigrette dressing 2 slices tomato Sliced avocado Omelet with ham, mushrooms, green peppers, tomato, onion, spinach 1 cup decaf coffee 10 oz bottle water  Snack to arrive at 2 pm: 1 orange  Dinner to arrive at 4:30 pm: 8 ounces of Soy milk 2 servings of fresh fruit cup 2 garden side salads 1 packet of balsamic vinaigrette dressing 2 slices tomato 9 slices of deli [place] meat with no bread 1 sweet potato 1 cup decaf coffee 10 oz bottle water  Saturday 7/4: Breakfast to arrive at 7:00 am: 8 ounces of Soy milk 1 apple 1 banana 4 hard boiled eggs 1 cup decaf coffee 10 oz bottle water  Snack at 9am: 1 apple  Lunch to arrive at 11:30 am: 8 ounces of Soy milk 2 servings of fresh fruit cup 2 garden side salads 1 packet of balsamic vinaigrette dressing 2 slices tomato Sliced avocado Omelet with mushrooms, green peppers, tomato, spinach 1 cup decaf coffee 10 oz bottle water  Snack to arrive at 2 pm: 1 orange  Dinner to arrive at 4:30 pm: 8 ounces of Soy milk 2 servings of fresh fruit cup 2 garden side salads 1 packet of balsamic vinaigrette dressing 2 slices tomato 9 slices of deli [place] meat with no bread 1 sweet potato 1 cup decaf coffee 10 oz bottle water  Sunday 7/5: Breakfast to arrive at 7:00 am: 8 ounces of Soy milk 1 apple 1 banana 4 hard boiled eggs 1 cup decaf coffee 10 oz bottle water  Snack at 9am: 1 apple  Lunch to arrive at 11:30 am: 8 ounces of Soy milk 2 servings of fresh fruit cup 2 garden side salads 2 packets of balsamic vinaigrette dressing 2 slices tomato Sliced avocado Omelet with mushrooms, green peppers, tomato,  spinach 1 cup decaf coffee 10 oz bottle water  Snack to arrive at 2 pm: 1 orange  Dinner to arrive at 4:30 pm: 8 ounces of Soy milk 2 servings of fresh fruit cup 2 garden side salads 1 packet of balsamic vinaigrette dressing 2 slices tomato 9 slices of deli [place] meat with no bread 1 sweet potato 1 cup decaf coffee 10 oz bottle water  Monday 7/6: Breakfast to arrive at 7:00 am: 8 ounces of Soy milk 1 apple 1 banana 4 hard boiled eggs 1 cup decaf coffee 10 oz bottle water  Snack at 9am: 1 apple  Corrin Parker, MS, RD, LDN Pager # [phone] After hours/ weekend pager # [phone]

## 2019-06-08 NOTE — Progress Notes (Signed)
Patient requested that Melatonin be ordered for bedtime for sleep; MD notified of same.

## 2019-06-08 NOTE — Progress Notes (Signed)
Vitals per flowsheet. Sinus bradycardia overnight. Lowest heart rate observed by writer was 38 (not sustained).   Angela [name] did not report any pain or discomfort overnight but was not able to sleep much.   Sitter at bedside.

## 2019-06-08 NOTE — Progress Notes (Signed)
FOLLOW UP PEDIATRIC/NEONATAL NUTRITION ASSESSMENT Date: 06/08/2019   Time: 11:37 AM  Reason for Assessment: Disordered eating  ASSESSMENT: Female 18 y.o.   Admission Dx/Hx: Bradycardia 18 y.o. female with a PMHx of Anorexia Nervosa presents with bradycardia and recent 2 lb weight loss.  Weight: 42.7 kg(1%, z-score -2.33) Length/Ht: 5' 2.56" (158.9 cm) (25%) Body mass index is 16.91 kg/m. Plotted on CDC growth chart  Assessment of Growth: Pt meets criteria for MODERATE MALNUTRITION as evidenced by BMI for age z-score of -2.10.  Estimated Needs:  47 ml/kg 47-51 Kcal/kg 2000-2200 calories/day 1.5-2 g Protein/kg   Pt with a 300 gram weight loss from yesterday. Nutrition plan to be increased to ~1850 kcal today. Will increase by ~200-250 kcal each day until full nutrition goal is met. Pt to meet full nutrition goal on Sunday, July 5th. Pt at risk for refeeding syndrome given malnutrition and prolonged restrictive eating. Meal completion 100% at lunch today. Pt reports no abdominal discomfort or complaints. Ensure supplementation to be given if meal completion incomplete. RD has ordered meals throughout the weekend.    Will continue to monitor.   Urine Output: 2350 ml  Related Meds: MVI, Ensure  Labs reviewed.   IVF:   NUTRITION DIAGNOSIS: -Malnutrition (NI-5.2) (moderate, chronic). related to disordered restrictive eating as evidenced by BMI for age z-score of -2.10.  Status: Ongoing  MONITORING/EVALUATION(Goals): PO intake Weight trends; goal of 100-200 gram gain/day Labs I/O's  INTERVENTION:   Provide Ensure Enlive po if meal completion incomplete.    Provide multivitamin once daily.    Monitor magnesium, potassium, and phosphorus daily, MD to replete as needed, as pt is at risk for refeeding syndrome given malnutrition and prolonged restrictive eating.  Patient to meet full nutrition goal on Sunday, July 5th.  Corrin Parker, MS, RD, LDN Pager #  [phone] After hours/ weekend pager # [phone]

## 2019-06-08 NOTE — Progress Notes (Signed)
Late entry from 06-07-2019 Yesterday when Nimco was admitted I re-oriented her to the eating disorder guidelines. She was disappointed that she required hospitalization but was pleasant and interactive. Her father arrived and I oriented him as well. I provided the sitter with the appropriate sitter information and posted the guidelines in Maeli's room. I also posted the accomodation log. Medical team is allowing her to use the bathroom instead of the bedside commode. Oveda and father requested information about how to release information to the parents as Hayley wishes to share her medical information with both her father and her mother. I also oriented the new interns/medical student to the eating disorder process here at [facility] and discussed post-hospitalization discharge plans with social worker: potential admission to an eating disorder program in [place].   7- 02-2019 The medical team is allowing Rashena to take a 10 minute seated shower once a day. This is noted on the accomodation log. Jesiah stated that she had not slept well last night. She is consuming 100% of her meals. Her mood is appropriate to the setting and she continues to actively participate in her treatment.

## 2019-06-08 NOTE — Progress Notes (Addendum)
Pediatric Teaching Program  Progress Note   Subjective  Angela [name] is a 18 y.o. female admitting for bradycardia relating to her eating disorder.  This morning she is upbeat.  Overnight her heart rate dropped to 38 while sleeping.  She does not endorse any LOC, dizziness, nausea/vomiting, palpitations or fatigue.    Objective  Temp:  [97.8 F (36.6 C)-98.1 F (36.7 C)] 98.1 F (36.7 C) (07/03 0733) Pulse Rate:  [41-58] 58 (07/03 0733) Resp:  [12-18] 18 (07/03 0733) BP: (94-107)/(51-67) 94/51 (07/03 0733) SpO2:  [96 %-99 %] 99 % (07/03 0417) Weight:  [42.7 kg-43 kg] 42.7 kg (07/03 0500)  Is/Os: Ate 100% of lunch and dinner yesterday.  Intake: 68.8 mL/kg Output: 4.4 mL/kg/hr  General: smiling female sitting upright in bed, cooperative and conversational HEENT: no conjunctival hemorrhage, PERRL CV: regular rate and rhythm, no murmurs  Pulm: clear to ascultation bilaterally  Abd: soft, non tender, no palpable masses  Skin: wart-like lesion without surrounding erythema or discharge  Ext: full range of motion, decreased muscle mass   Labs and studies were reviewed and were significant for:  SARS Coronavirus 2 - Negative      Comprehensive metabolic panel - BUN 28 < 21, improving   Phosphorus WNL   Magnesium 2.5 < 2.2  WNL   T3 - 44 decreased   Rapid urine drug screen - pending    Pregnancy, urine - pending    Urinalysis, Routine w reflex microscopic- WNL  Pelvic US Impression: No cause for amenorrhea identified.  No acute abnormalities.  Assessment  Angela [name] is a 18 y.o. female admitted for bradycardia secondary to her eating disorder (anorexia nervosa).  She is cooperative with her protocol guidelines.  She ate 100% of her meals for lunch and dinner yesterday.  Patient heart rate dropped to 38 overnight while sleeping therefore is not medically cleared for discharge at this time.   Plan  Bradycardia in context of eating disorder - F/U on labs:, urine pregnancy,  urine drug screen, forgo daily BMP - 1:1 sitter  - Bed rest with bathroom privileges - 1st shower: sitting 10 minute allotment  - Cardiac monitoring - Daily weights - Q4 BP - Daily orthostatic vital signs - Q4 vital signs - strict I/O - repeat EKG today as patient had HR 38 overnight - defer checking refeeding labs since last 2 BMPs wnl and patient is not a new ED patient   Amenorrhea  - likely secondary to anorexia, nl pelvic ultrasound    FENGI: - Ensure 237 ml tid with meals, follow eating disorder protocol - Multivitamin 1 tablet - Regular diet with thin liquids (1600 kcal)   Access: none   Dispo: Once taking all appropriate nutrition, no symptomatic orthostasis, HR 45 or above during day and HR 40 or above while sleeping then will discuss discharge plans with adolescent med  Interpreter present: no   LOS: 1 day   Lyndee Hensen, DO 06/08/2019, 7:41 AM   I personally saw and evaluated the patient, and participated in the management and treatment plan as documented in the resident's note.  Jeanella Flattery, MD 06/08/2019 1:54 PM

## 2019-06-09 DIAGNOSIS — F5000 Anorexia nervosa, unspecified: Principal | ICD-10-CM

## 2019-06-09 LAB — URINALYSIS, ROUTINE W REFLEX MICROSCOPIC
Bilirubin Urine: NEGATIVE
Glucose, UA: NEGATIVE mg/dL
Hgb urine dipstick: NEGATIVE
Ketones, ur: NEGATIVE mg/dL
Leukocytes,Ua: NEGATIVE
Nitrite: NEGATIVE
Protein, ur: NEGATIVE mg/dL
Specific Gravity, Urine: 1.004 — ABNORMAL LOW (ref 1.005–1.030)
pH: 7 (ref 5.0–8.0)

## 2019-06-09 LAB — RAPID URINE DRUG SCREEN, HOSP PERFORMED
Amphetamines: NOT DETECTED
Barbiturates: NOT DETECTED
Benzodiazepines: NOT DETECTED
Cocaine: NOT DETECTED
Opiates: NOT DETECTED
Tetrahydrocannabinol: NOT DETECTED

## 2019-06-09 LAB — PREGNANCY, URINE: Preg Test, Ur: NEGATIVE

## 2019-06-09 NOTE — Progress Notes (Signed)
NSR heart rate above 60 most of the day some fluctuation noted when napping at 58. Can now drink only 7 water bottles extra than what is with meals. Consumed 100% of meals. Pleasant and interactive with staff.

## 2019-06-09 NOTE — Progress Notes (Signed)
Pediatric Teaching Program  Progress Note   Subjective  Prosperity continued to have brief episodes of bradycardia to minimum of 39 bpm overnight. She is otherwise feeling well denies dizziness, lightheadedness, near syncope, abdominal pain, HA, nausea or vomiting.   Objective  Temp:  [97.5 F (36.4 C)-98.4 F (36.9 C)] 97.5 F (36.4 C) (07/04 0749) Pulse Rate:  [46-72] 68 (07/04 0749) Resp:  [10-20] 18 (07/04 0749) BP: (86-99)/(48-66) 99/66 (07/04 0749) SpO2:  [97 %-100 %] 100 % (07/04 0749) Weight:  [42.4 kg] 42.4 kg (07/04 0445)  General:frail  appearing female sitting up in bed talking with parent  HEENT: MMM with no oropharyngeal erythema  CV: RRR on exam with normal s1 and s2, HR monitor reporting HR of 70  Pulm: CTAB without wheezing or crackles  Abd: petite abdomen, non schaphoid, minimal bowel sounds, no palpable masses Skin: warm and dry, visible vasculature throughout  Ext: moves all, non edematous, non cyanotic   Labs and studies were reviewed and were significant for:  none  Assessment  Angela [name] is a 18 y.o. female admitted for bradycardia in the setting of anorexia nervosa, who is doing well with eating disorder protocol. Still working towards meeting nutritional goals.   Plan   Bradycardia in setting of eating disorder  - Patient has 1:1 sitter  -bed rest with bathroom privileges  -Patient allowed to shower for 10 minutes  -continue daily weights  -strict I/O measurements -EKG today, HR of 39 while sleeping   FEN/GI -Ensure 237 mL TID with meals, per eating disorder protocol  -multivitamin  -Dietician plan to increase to 1850 kcal with plan to increase by 200-250 kcal per day   Access: none   Dispo: pending stable HR of 45 BPM (awake) and 40 or above while sleeping, meeting nutritional goals     Interpreter present: no   LOS: 2 days   Stark Klein, MD 06/09/2019, 8:54 AM

## 2019-06-09 NOTE — Plan of Care (Signed)
Focus of Shift:  Maintain NSR with heart rate > 60 and become normotensive; Increase PO intake and tolerate without any nausea or emesis episodes.

## 2019-06-09 NOTE — Discharge Summary (Addendum)
Pediatric Teaching Program Discharge Summary [address], [place] [address] Phone: [phone] Fax: [phone]   Patient Details  Name: Angela [name] MRN: [id number] DOB: 08/17/2001 Age: 18 y.o.          Gender: female  Admission/Discharge Information   Admit Date:  06/07/2019  Discharge Date: 06/12/2019  Length of Stay: 5   Reason(s) for Hospitalization  Bradycardia  Problem List   Principal Problem:   Bradycardia Active Problems:   Eating disorder   Moderate protein-calorie malnutrition ([place])   Polyuria   Final Diagnoses  Bradycardia  Polyuria   Brief Hospital Course (including significant findings and pertinent lab/radiology studies)  SAMA ARAUZ is a 18 y.o. female with a past medical history of anorexia nervosa, presenting with bradycardia (HR 44) and recent 2 lb weight loss at her adolescent medicine visit with Hoyt Koch, NP on 06/07/2019. She reported eating normal foods and exercising 30 minutes-2 hours per day. She has had 4 cycles in her lifetime since the 9th grade, last being December 2019.   Symptomatic Eating Disorder: Admitted 06/07/2019. She was placed with a 1:1 sitter on bedrest with bathroom privileges, cardiac monitoring, daily weights and orthostatic vital signs, strict I/O. Placed on regular diet with thin liquids at 1600 kcal and ensure 222m TID with meals. Overnight after admission, she had very brief episodes of bradycardia with HR of 38. She was upbeat the following morning and denies any LOC, dizziness, nausea/vomiting, palpitations, or fatigue. Both BMP's checked were within normal limits, specifically potassium and phosphorus were normal. Overnight, she had some brief episodes of bradycardia to a minimum of 39 overnight (06/09/2019). Nutrition increased diet to 1850 kcal with a plan to increase by 200-250 kcal per day beginning 06/09/2019. Due to excessive water intake, she was limited to 7 bottles  inclusing with meals. Overnight (07/05-07/05/2019), Shaakira maintained a heart rate of around 45, not dropping below 40. Again overnight (07/06-07/06/2019), CChrys Racermaintained a heart rate greater than 40 while sleeping. During the day, HR 50s-70s without any symptoms, including lightheadedness/dizziness. Chem 10 on 7/6 reassuring against refeeding syndrome. CTieraagreed to seeking [facility] and is awaiting a bed opening with the help of Social Services [employer]   For safe discharge, we stressed to CShelethathat she needs to follows colsely with NP CHoyt Kochin Adolescent clinic, nutrition, and therapy. We also [facility] as did Clinical Psychology Dr. WHulen Skains  Primary Polydipsia  On 7/6, Peds Endo was consulted given Rosalyn's impressive polyuria, urinating > 5 L/day, -19,442 mL since admission. Peds Endocrine SAlwyn RenFNP recommended overnight fluid depravation and am serum and urine studies to delineate primary polydipsia versus DI (central or nephrogenic). Serum BMP(138), Serum Osm(292), UA(SG 1018), Urine Osm(689), support the likelihood of primary polydipsia over DI, although partial DI or ADH abnormalities secondary to anroexia remain a possibility. On history, CKevionnaand her father both endorse that she drinks a lot of water compared to her peers and urinates with frequency, as well. Given her normal electrolytes, including normal sodium and potassium, it seems that her body is responded appropriately to fluid depravation.   For safe discharge, Peds Endo recommended weekly BMPs to check sodium and other electrolytes, fluid limitation to 70-80 oz/day, following nutrition recommendation for diet, and follow-up with Peds Endo outpatient. We educated CSamayaon symptoms of electrolytes abnormalities, including and especially altered level of consciousness, lethargy, confusion.    Procedures/Operations  None    Consultants  Peds Endocrinology Nutrition/RD Child Psychology Social Services  Focused Discharge Exam  Temp:  [97.7 F (36.5 C)-98.5 F (36.9 C)] 98.5 F (36.9 C) (07/07 1110) Pulse Rate:  [57-71] 71 (07/07 1110) Resp:  [16-47] 18 (07/07 1110) BP: (84-99)/(43-68) 99/68 (07/07 1110) SpO2:  [95 %-100 %] 99 % (07/07 0444) Weight:  [42.6 kg] 42.6 kg (07/07 0500)  General: Well-appearing, thin female, sitting up in bed working on the computer; no acute distress CV: regular rate, normal S1/S2; no murmur, rubs, or gallops; 2+ distal pulses (BL radial and distal pedis) Pulm: normal work of breathing, lungs clear to auscultation bilaterally  Abd: + bowel sounds; soft, non-tender, non-distended  Interpreter present: no  Discharge Instructions   Discharge Weight: 42.6 kg   Discharge Condition: Improved bradycardia   Discharge Diet: Eating Disorder Diet per Nutrition RD  Discharge Activity: Exercise per Adolescent clinic   Discharge Medication List   Allergies as of 06/12/2019      Reactions   Gluten Meal Other (See Comments)   Stomach Pain   Lactose Intolerance (gi) Diarrhea      Medication List    TAKE these medications   feeding supplement (ENSURE ENLIVE) Liqd Take 237 mLs by mouth as needed (if meal goal not met).   hydrOXYzine 10 MG tablet Commonly known as: ATARAX/VISTARIL Take 1 tablet (10 mg total) by mouth 3 (three) times daily as needed.   multivitamin with minerals Tabs tablet Take 1 tablet by mouth daily.   ZyPREXA 2.5 MG tablet Generic drug: OLANZapine Take 2.5 mg by mouth at bedtime.      Immunizations Given (date): none  Follow-up Issues and Recommendations   Adolescent Clinic re Anorexia Nervosa, Bradycardia, weekly BMPs   Nutritionist re Anorexia Nervosa  Therapy re Anorexia Nervosa  Pediatric Endocrinology re Polyuria, Polydipsia    Pending Results  None   Future Appointments   Follow-up Information    Parthenia Ames, NP. Go to.    Specialty: Family Medicine Why: Wednesday 7/8 @ 930 AM, in-person visit Contact information: [address] [place] [address] [place] Go to.   Why: Wednesday, 7/8 @ 800 AM, in-person visit        Evelina Dun. Go to.   Why: Wednesday, 7/8 @ 1200 noon, video visit           Alfonso Ellis, MD 06/12/2019, 12:02 PM  I saw and evaluated the patient, performing the key elements of the service. I developed the management plan that is described in the resident's note, and I agree with the content. This discharge summary has been edited by me to reflect my own findings and physical exam.  Earl Many, MD                  06/14/2019, 1:42 PM

## 2019-06-10 ENCOUNTER — Encounter: Payer: Self-pay | Admitting: Family

## 2019-06-10 LAB — URINALYSIS, ROUTINE W REFLEX MICROSCOPIC
Bilirubin Urine: NEGATIVE
Glucose, UA: NEGATIVE mg/dL
Hgb urine dipstick: NEGATIVE
Ketones, ur: NEGATIVE mg/dL
Leukocytes,Ua: NEGATIVE
Nitrite: NEGATIVE
Protein, ur: NEGATIVE mg/dL
Specific Gravity, Urine: 1.004 — ABNORMAL LOW (ref 1.005–1.030)
pH: 7 (ref 5.0–8.0)

## 2019-06-10 NOTE — Progress Notes (Addendum)
Pediatric Teaching Program  Progress Note   Subjective   Overnight, Angela [name] had HR ranges mostly greatly than 50 with one episode of bradycardia to 39 (not recorded in chart).  Noted that Angela [name] has been drinking excess water.  Objective  Temp:  [97.3 F (36.3 C)-99 F (37.2 C)] 97.3 F (36.3 C) (07/05 0741) Pulse Rate:  [42-64] 64 (07/05 0741) Resp:  [14-18] 18 (07/05 0741) BP: (76-102)/(40-64) 93/55 (07/05 0741) SpO2:  [97 %-100 %] 98 % (07/05 0741) Weight:  [42.5 kg] 42.5 kg (07/05 0456)    Intake/Output Summary (Last 24 hours) at 06/10/2019 0848 Last data filed at 06/10/2019 0659 Gross per 24 hour  Intake 3120 ml  Output 6800 ml  Net -3680 ml   Orthostatics still positive General: thin female in NAD sitting up in bed, pleasantly conversational with sitter HEENT: MMM with no oropharngeal exudate or erythema CV: RRR without murmur, normal S1 and s2  Pulm: CTAB without wheezing or crackles  Abd: soft, NT, minimal bowel sounds Skin: warm, dry   Labs and studies were reviewed and were significant for: NEG UDS U/A NORMAL  Assessment  Angela [name] is a 18 y.o. female admitted for bradycardia in the setting of anorexia nervosa improving with increasing HR with normal electrolytes and U/A. Weight is up 100 g from yesterday but still down from admission.  Plan   Bradycardia in setting of anorexia nervosa  -low HR of 42 overnight, mostly maintained above 40  -continue orthostatics -patient continues to have positive orthostatic measures but is asymptomatic -Continue 1:1 sitter  -bed rest with bathroom privileges  -patient allowed to shower for 10 minutes  -continue daily weights  -strict I/O measurements -limit fluids to 7 10oz bottles/day (including water on meal trays)  FEN/GI -MVI -Will meet full nutritional goal today as outlined by RD -Last BMP on 7/2 WNL so will not repeat at this time    Access: none   Dispo: discharge home pending stabilization of  HR of 45 BPM or above (awake) and 40 or above (sleeping), meeting nutritional goals    Interpreter present: no   LOS: 3 days   Stark Klein, MD 06/10/2019, 8:48 AM   I personally saw and evaluated the patient, and participated in the management and treatment plan as documented in the resident's note with changes made above.  Jeanella Flattery, MD 06/10/2019 10:31 AM

## 2019-06-10 NOTE — Progress Notes (Signed)
Vitals WDL other than bradycardia. Heart rates mostly over 50, occasionally lower when sleeping.   Drinking/voiding well. Was not able to sleep much overnight.   Sitter at bedside

## 2019-06-10 NOTE — Progress Notes (Signed)
End of shift note:  Pt has had a good day, VSS and afebrile. Pt has been alert and interactive, very cooperative with staff. Lung sounds clear, RR 16-18, O2 sats 98% and greater on RA, no WOB. HR at the lowest has been 47 when sleeping, otherwise 50's-60's, SB/NSR on monitor, pulses +3 in upper extremties, +2 in lower, cap refill less than 3 seconds. Pt has ate 100% of all meals and required no supplements. BM x1 small occurrence, good UOP. May only have 7 waters per day now and that includes with meals. No PIV. Sitter at bedside. Father by to visit this evening, attentive to all needs.

## 2019-06-11 ENCOUNTER — Ambulatory Visit: Payer: Self-pay | Admitting: Family

## 2019-06-11 DIAGNOSIS — R3589 Other polyuria: Secondary | ICD-10-CM

## 2019-06-11 DIAGNOSIS — R358 Other polyuria: Secondary | ICD-10-CM

## 2019-06-11 LAB — PHOSPHORUS: Phosphorus: 3.8 mg/dL (ref 2.5–4.6)

## 2019-06-11 LAB — BASIC METABOLIC PANEL
Anion gap: 10 (ref 5–15)
BUN: 17 mg/dL (ref 6–20)
CO2: 28 mmol/L (ref 22–32)
Calcium: 9.3 mg/dL (ref 8.9–10.3)
Chloride: 101 mmol/L (ref 98–111)
Creatinine, Ser: 0.55 mg/dL (ref 0.44–1.00)
GFR calc Af Amer: >60 mL/min (ref >60)
GFR calc non Af Amer: >60 mL/min (ref >60)
Glucose, Bld: 79 mg/dL (ref 70–99)
Potassium: 4.2 mmol/L (ref 3.5–5.1)
Sodium: 139 mmol/L (ref 135–145)

## 2019-06-11 LAB — SODIUM, URINE, RANDOM: Sodium, Ur: 37 mmol/L

## 2019-06-11 LAB — OSMOLALITY, URINE: Osmolality, Ur: 236 mOsm/kg — ABNORMAL LOW (ref 300–900)

## 2019-06-11 LAB — URINALYSIS, ROUTINE W REFLEX MICROSCOPIC
Bilirubin Urine: NEGATIVE
Glucose, UA: NEGATIVE mg/dL
Hgb urine dipstick: NEGATIVE
Ketones, ur: NEGATIVE mg/dL
Leukocytes,Ua: NEGATIVE
Nitrite: NEGATIVE
Protein, ur: NEGATIVE mg/dL
Specific Gravity, Urine: 1.005 (ref 1.005–1.030)
pH: 7 (ref 5.0–8.0)

## 2019-06-11 LAB — OSMOLALITY: Osmolality: 289 mOsm/kg (ref 275–295)

## 2019-06-11 LAB — MAGNESIUM: Magnesium: 2.2 mg/dL (ref 1.7–2.4)

## 2019-06-11 NOTE — Progress Notes (Signed)
Nutrition Brief Note  List of food items RD has ordered at meals. RN and care team may modify meal tray if food items upon arrival do not match meal RD has ordered.  Monday 7/6: Lunch to arrive at 11:30 am: 8 ounces of Soy milk 2 servings of fresh fruit cup 2 garden side salads 1 packet of balsamic vinaigrette dressing Sliced avocado Omelet with mushrooms, green peppers, tomato, spinach 1 cup decaf coffee 10 oz bottle water  Snack to arrive at 2 pm: 2 fruit cups  Dinner to arrive at 4:30 pm: 8 ounces of Soy milk 2 servings of fresh fruit cup 2 garden side salads 1 packet of balsamic vinaigrette dressing Sliced avocado Omelet with mushrooms, green peppers, tomato, spinach 1 cup decaf coffee 10 oz bottle water  Tuesday 7/7: Breakfast to arrive at 7:00 am: 8 ounces of Soy milk 1 apple 1 banana 4 hard boiled eggs 1 cup decaf coffee 10 oz bottle water  Snack at 9am: 1 apple  Corrin Parker, MS, RD, LDN Pager # [phone] After hours/ weekend pager # [phone]

## 2019-06-11 NOTE — Progress Notes (Addendum)
CSW still following for assistance with placement into treatment facility. CSW has reached out to [facility] and is awaiting return call from admissions regarding status of referral and bed availability. CSW to update staff once return call has been received.   Update: CSW received return call from admissions who reported, "they were still waiting for their clinical team to review the referral". Admissions staff unable to provide a timeframe for when referral would be reviewed or for when a bed may be available. CSW advised to follow up with admissions tomorrow.  Elijio Miles, [place]  [facility] and [employer] [phone]

## 2019-06-11 NOTE — Patient Care Conference (Addendum)
Family Care Conference   .    K. Hulen Skains, Pediatric Psychologist   .   N. Clancy, [place] Department   .   A. Rojelio Brenner Resident  .  Attending:Ak[place]  Nurse: [place] of Care: May walk in the  hall for 15 minutes total, Dad may have a meal with Chrys Racer, and may have a 10 minute standing shower. HR improved at night and during the day.

## 2019-06-11 NOTE — Progress Notes (Signed)
Pediatric Teaching Program  Progress Note   Subjective  Overnight, HR > 40, lowest 44.   Angela [name] reports sleeping better last night than in previous nights, but her sleep is still broken. She reports eating good a dinner and breakfast. She denies any lightheadedness or other symptoms.    Her only compliant today is that she is thirsty now that her water/fluid intake is restricted. She commented that drinking water helps pass the time.   Objective  Temp:  [97.6 F (36.4 C)-98.2 F (36.8 C)] 98.2 F (36.8 C) (07/06 0829) Pulse Rate:  [45-75] 75 (07/06 0829) Resp:  [15-20] 20 (07/06 0829) BP: (89-105)/(52-62) 105/60 (07/06 0829) SpO2:  [97 %-100 %] 100 % (07/06 0829) Weight:  [42.9 kg] 42.9 kg (07/06 0500)  24 hr Intake/Output   Last data filed at 06/11/2019 1034 Gross per 24 hour  Intake 2340 ml  Output 7450 ml  Net -5110 ml   General: thin, well-appearing female in no acute distress sitting up in bed  HEENT: OP clear, normal denition CV: regular rate; no murmurs, rubs, or gallops appreciated Pulm: normal works of breathing, lungs clear to auscultation bilaterally Abd: + bowel sounds, scaphoid, non-distended, non-tender to palpation Ext: warm and well perfused, 2+ distal pulses (radial and dorsal pedis)  Labs and studies were reviewed and were significant for: 7/6 UA: SG 1.005 < 1.004 (7/5)  Assessment  Angela [name] is a 18 y.o. female with anorexia nervosa admitted for bradycardia; her heart rate is improving and now usually between 50-70s, overnight low in mid 40s. Her electrolytes were normal on admission; UA with slightly decreased specific gravity; but improved since limiting water intake. He weight was down, but is now almost back to her admission weight (43 > 42.9 kg). Angela [name] has agreed to a long term facility and is medically improving, we are awaiting labs given her polyuria.    Plan   Bradycardia in setting of anorexia nervosa  HR >40, mostly 50-70s while  awake, mid 40s-50s while asleep  Orthostatic BPs  Continue 1:1 sitter  Allow meals with father   Allow hallway privileges  Allow standing shower, 10 min  Allow bathroom privileges  Continue Daily Weights    Strict I&O   Polyuria   Limit fluids to 7 10 oz water bottles/day, including with meal trays  Possible primary polydipsia versus DI   BMP, Mag, Phos  Serum Osm  Urine Osm  Urine Sodium   FENGI  Multivitamin  Full nutrition per RD  Disposition  Pending BMP, Mag, Phos, Serum Osm, Urine Osm, Urine Na.   Follow-up scheduled with Hoyt Koch   Pending placement, possibly [facility]. Social Services and Clinical Psychology following, greatly appreciate   Interpreter present: no   LOS: 4 days   Alfonso Ellis, MD 06/11/2019, 10:52 AM

## 2019-06-11 NOTE — Progress Notes (Signed)
FOLLOW UP PEDIATRIC/NEONATAL NUTRITION ASSESSMENT Date: 06/11/2019   Time: 12:14 PM  RD working remotely.  Reason for Assessment: Disordered eating  ASSESSMENT: Female 18 y.o.   Admission Dx/Hx: Bradycardia 18 y.o. female with a PMHx of Anorexia Nervosa presents with bradycardia and recent 2 lb weight loss.  Weight: 42.9 kg(1%, z-score -2.29) Length/Ht: 5' 2.56" (158.9 cm) (25%) Body mass index is 16.99 kg/m. Plotted on CDC growth chart  Assessment of Growth: Pt meets criteria for MODERATE MALNUTRITION as evidenced by BMI for age z-score of -2.10.  Estimated Needs:  47 ml/kg 47-51 Kcal/kg 2000-2200 calories/day 1.5-2 g Protein/kg   Pt with a 400 gram weight gain from yesterday. Pt is currently meeting her full nutrition goal ~2100 kcal/day. Meal completion has been 100%. Pt reports no abdominal discomfort or complaints. Ensure supplementation to be given if meal completion incomplete.   Will continue to monitor.   Urine Output: 7.4 ml/kg/hr  Related Meds: MVI, Ensure  Labs reviewed.   IVF:   NUTRITION DIAGNOSIS: -Malnutrition (NI-5.2) (moderate, chronic). related to disordered restrictive eating as evidenced by BMI for age z-score of -2.10.  Status: Ongoing  MONITORING/EVALUATION(Goals): PO intake Weight trends; goal of 100-200 gram gain/day Labs I/O's  INTERVENTION:   Provide Ensure Enlive po if meal completion incomplete.    Provide multivitamin once daily.    Limit fluids to 7, 10 oz water bottles/day (2100 ml/day).  Corrin Parker, MS, RD, LDN Pager # [phone] After hours/ weekend pager # [phone]

## 2019-06-12 ENCOUNTER — Other Ambulatory Visit: Payer: Self-pay

## 2019-06-12 LAB — OSMOLALITY: Osmolality: 292 mOsm/kg (ref 275–295)

## 2019-06-12 LAB — URINALYSIS, ROUTINE W REFLEX MICROSCOPIC
Bilirubin Urine: NEGATIVE
Glucose, UA: 50 mg/dL — AB
Hgb urine dipstick: NEGATIVE
Ketones, ur: NEGATIVE mg/dL
Leukocytes,Ua: NEGATIVE
Nitrite: NEGATIVE
Protein, ur: NEGATIVE mg/dL
Specific Gravity, Urine: 1.018 (ref 1.005–1.030)
pH: 8 (ref 5.0–8.0)

## 2019-06-12 LAB — BASIC METABOLIC PANEL
Anion gap: 9 (ref 5–15)
BUN: 15 mg/dL (ref 6–20)
CO2: 28 mmol/L (ref 22–32)
Calcium: 9.4 mg/dL (ref 8.9–10.3)
Chloride: 101 mmol/L (ref 98–111)
Creatinine, Ser: 0.63 mg/dL (ref 0.44–1.00)
GFR calc Af Amer: >60 mL/min (ref >60)
GFR calc non Af Amer: >60 mL/min (ref >60)
Glucose, Bld: 85 mg/dL (ref 70–99)
Potassium: 4.2 mmol/L (ref 3.5–5.1)
Sodium: 138 mmol/L (ref 135–145)

## 2019-06-12 LAB — SODIUM, URINE, RANDOM: Sodium, Ur: 44 mmol/L

## 2019-06-12 LAB — OSMOLALITY, URINE: Osmolality, Ur: 689 mOsm/kg (ref 300–900)

## 2019-06-12 LAB — CREATININE, URINE, RANDOM: Creatinine, Urine: 86.27 mg/dL

## 2019-06-12 NOTE — Progress Notes (Signed)
Discussed patients case with Resident MD on 06/11/2019. Concern for polyuria and polydipsia.   Recommended to start fluid restriction beginning at 10pm and then ending at [place] at 6am: BMP, Serum osmolality, urine specific gravity, urine sodium, urine osmolality and urine creatinine.   Assessment post labs:    Polyuria likely due to multiple factors, including erratic anti-diuretic hormone release due to anorexia nervosa and increased fluid/water intake related to her anorexia. It is reassuring that she is able to concentrate her urine after 10 hours of water deprivation (last recorded intake was at 6PM, labs obtained at 4AM); it is also reassuring that sodium levels have remained normal throughout hospitalization. Treatment with desmopressin is not indicated at this time.    Hermenia Bers,  FNP-C  Pediatric Specialist  [address] [place]  [place], [address]  Tele: [phone]

## 2019-06-12 NOTE — Progress Notes (Addendum)
Plan for home discharge   - Restrict fluids to 8 10 oz bottles per day - Weekly BMP will be done by Adolescent clinic  - She has appointment with [facility] (Dr. Charna Archer) on 07/29 at 11am.   This plan was discussed in detail with Dr. Charna Archer and Hoyt Koch NP.

## 2019-06-12 NOTE — Progress Notes (Signed)
CSW received call back from Yvonne Kendall., residential Investment banker, operational for [employer]. Per Gerald Stabs, there are currently two open residential beds at the [place] site. Gerald Stabs will follow up with CSW to schedule patient's phone intake assessment.   CSW spoke with patient to provide update. Patient has continued to research treatment programs and requested that additional information be sent. Patient signed ROI for [facility] in [place], [place]. Information sent for review at patient's request. CSW will follow up.   Madelaine Bhat, [place]

## 2019-06-12 NOTE — Progress Notes (Signed)
FOLLOW UP PEDIATRIC/NEONATAL NUTRITION ASSESSMENT Date: 06/12/2019   Time: 10:40 AM  Reason for Assessment: Disordered eating  ASSESSMENT: Female 18 y.o.   Admission Dx/Hx: Bradycardia 18 y.o. female with a PMHx of Anorexia Nervosa presents with bradycardia and recent 2 lb weight loss.  Weight: 42.6 kg(1%, z-score -2.36) Length/Ht: 5' 2.56" (158.9 cm) (25%) Body mass index is 16.87 kg/m. Plotted on CDC growth chart  Assessment of Growth: Pt meets criteria for MODERATE MALNUTRITION as evidenced by BMI for age z-score of -2.10.  Estimated Needs:  47 ml/kg 47-54 Kcal/kg 2000-2300 calories/day 1.5-2 g Protein/kg   Pt with a 300 gram weight loss from yesterday. Nutrition goal increased to ~2300 kcal today to aid in adequate weight gain. Noted pt with polyuria. Weight loss may be related to fluid status. MD reports polyuria likely related to multiple factors and antidiuretic hormone release due to anorexia nervosa. Fluid restriction per MD. Endocrinology has been consulted and following. Meal completion has been 100%. Pt reports no abdominal discomfort or complaints. Ensure supplementation to be given if meal completion incomplete.   Will continue to monitor.   Urine Output: 5.6 ml/kg/hr Net I/O: -20 L since admission  Related Meds: MVI, Ensure  Labs reviewed.   IVF:   NUTRITION DIAGNOSIS: -Malnutrition (NI-5.2) (moderate, chronic). related to disordered restrictive eating as evidenced by BMI for age z-score of -2.10.  Status: Ongoing  MONITORING/EVALUATION(Goals): PO intake Weight trends; goal of 100-200 gram gain/day Labs I/O's  INTERVENTION:   Provide Ensure Enlive po if meal completion incomplete.    Provide multivitamin once daily.    Fluid restriction per MD.   Corrin Parker, MS, RD, LDN Pager # [phone] After hours/ weekend pager # [phone]

## 2019-06-12 NOTE — Discharge Instructions (Signed)
Angela [name] was admitted to the hospital for bradycardia (slow heart rate). We continued to monitor you carefully while you ate a healthy diet and vital signs improved. You also had a large amount of urine output while here and was drinking quite a large amount of water as well which can sometimes be a sign of an underlying endocrine disorder. We talked to endocrine who recommended some labs which were all normal and reassuring. While it is safe at this time to be discharged from the hospital, we would like to continue to monitor your electrolytes closely to make sure nothing develops down the road. With this is mind, you will have your electrolytes checked every week. We recommend you continue to restrict your water intake to 7-8 10oz bottles of water per day (a total of 70-80oz per day). Our endocrinology team will also schedule an appointment for you within the next month. If that appointment is not made within the next 2 weeks please call [phone] to schedule an appointment.   When to call for help: Call 911 if your child needs immediate help - for example, if they are having trouble breathing (working hard to breathe, making noises when breathing (grunting), not breathing, pausing when breathing, is pale or blue in color).  Call Primary Pediatrician for: - Fever greater than 101degrees Farenheit not responsive to medications or lasting longer than 3 days - Pain that is not well controlled by medication - Any Concerns for Dehydration such as decreased urine output, dry/cracked lips, decreased oral intake, stops making tears or urinates less than once every 8-10 hours - Any Respiratory Distress or Increased Work of Breathing - Any Changes in behavior such as increased sleepiness or decrease activity level - Any Diet Intolerance such as nausea, vomiting, diarrhea, or decreased oral intake - Any Medical Questions or Concerns

## 2019-06-12 NOTE — Progress Notes (Signed)
Nutrition Brief Note  List of food items RD has ordered at meals. RN and care team may modify meal tray if food items upon arrival do not match meal RD has ordered.  Tuesday 7/7: Lunch to arrive at 11:30 am: 8 ounces of Soy milk 2 servings of fresh fruit cup 2 garden side salads 1 packet of balsamic vinaigrette dressing Sliced avocado Omelet with mushrooms, green peppers, spinach 1 cup decaf coffee 10 oz bottle water  Snack to arrive at 2 pm: 2 fruit cups  Dinner to arrive at 4:30 pm: 8 ounces of Soy milk 2 servings of fresh fruit cup 2 garden side salads 1 packet of balsamic vinaigrette dressing Sliced avocado Omelet with mushrooms, green peppers, spinach 1 cup decaf coffee 10 oz bottle water  Wednesday 7/8: Breakfast to arrive at 7:00 am: 8 ounces of Soy milk 1 apple 1 banana 4 hard boiled eggs 1 cup decaf coffee 10 oz bottle water  Snack at 9am: 1 apple  Corrin Parker, MS, RD, LDN Pager # [phone] After hours/ weekend pager # [phone]

## 2019-06-12 NOTE — Progress Notes (Signed)
CSW called to [employer] to follow up on referral status. Per intake, referral to be sent to [facility] of [facility] residential program, today for review. Gerald Stabs to follow up with CSW regarding status.   Madelaine Bhat, [place]

## 2019-06-13 ENCOUNTER — Ambulatory Visit (INDEPENDENT_AMBULATORY_CARE_PROVIDER_SITE_OTHER): Payer: No Typology Code available for payment source | Admitting: Family

## 2019-06-13 ENCOUNTER — Ambulatory Visit (INDEPENDENT_AMBULATORY_CARE_PROVIDER_SITE_OTHER): Payer: Self-pay | Admitting: Licensed Clinical Social Worker

## 2019-06-13 ENCOUNTER — Encounter: Payer: Self-pay | Admitting: Family

## 2019-06-13 ENCOUNTER — Other Ambulatory Visit: Payer: Self-pay | Admitting: Family

## 2019-06-13 ENCOUNTER — Encounter: Payer: Self-pay | Admitting: Registered"

## 2019-06-13 ENCOUNTER — Encounter: Payer: No Typology Code available for payment source | Admitting: Registered"

## 2019-06-13 ENCOUNTER — Other Ambulatory Visit: Payer: Self-pay

## 2019-06-13 VITALS — BP 111/73 | HR 70 | Ht 62.84 in | Wt 99.0 lb

## 2019-06-13 DIAGNOSIS — Z1389 Encounter for screening for other disorder: Secondary | ICD-10-CM | POA: Diagnosis not present

## 2019-06-13 DIAGNOSIS — F5001 Anorexia nervosa, restricting type: Secondary | ICD-10-CM

## 2019-06-13 DIAGNOSIS — R3589 Other polyuria: Secondary | ICD-10-CM

## 2019-06-13 DIAGNOSIS — R631 Polydipsia: Secondary | ICD-10-CM | POA: Diagnosis not present

## 2019-06-13 DIAGNOSIS — R358 Other polyuria: Secondary | ICD-10-CM | POA: Diagnosis not present

## 2019-06-13 DIAGNOSIS — Z713 Dietary counseling and surveillance: Secondary | ICD-10-CM | POA: Diagnosis not present

## 2019-06-13 DIAGNOSIS — F50019 Anorexia nervosa, restricting type, unspecified: Secondary | ICD-10-CM

## 2019-06-13 DIAGNOSIS — F4329 Adjustment disorder with other symptoms: Secondary | ICD-10-CM

## 2019-06-13 LAB — POCT URINALYSIS DIPSTICK
Bilirubin, UA: NEGATIVE
Blood, UA: NEGATIVE
Glucose, UA: NEGATIVE
Ketones, UA: NEGATIVE
Leukocytes, UA: NEGATIVE
Nitrite, UA: NEGATIVE
Protein, UA: NEGATIVE
Spec Grav, UA: <=1.005 — AB (ref 1.010–1.025)
Urobilinogen, UA: NEGATIVE E.U./dL — AB
pH, UA: 8.5 — AB (ref 5.0–8.0)

## 2019-06-13 MED ORDER — SERTRALINE HCL 25 MG PO TABS: 25.0000 mg | ORAL_TABLET | Freq: Every day | ORAL | 1 refills | Status: DC

## 2019-06-13 NOTE — Progress Notes (Signed)
Appointment start time: 8:00 Appointment end time: 8:52  Patient was seen on 06/13/2019 for nutrition counseling pertaining to disordered eating  Primary care provider: Dr. Willa Roughueben Therapist: Carollee HerterShannon at adolescent medicine  ROI: 05/08/2019 Any other medical team members: adolescent nedicine Parents: none   Assessment  Pt arrives after being inpatient for 5-6 days due to low heart rate. Pt states she does not want to go to eating disorder facility. It was her last result. Feels that a facility will take her freedom away and she will not have options of what to eat. States mom is mad at her and its frustrating because mom does not understand how hard this is for her. Mom thinks its easy to "just eat".   States she feels like she has a gluten intolerance. Wants to be tested.    Ensure may run out 07/14; does not plan to purchase more due to preparing to go to treatment facility. Next appt is 07/14.  Previous appts: Still drinking 2-3 Ensures/day. States she is not doing anything to control weight gain. Wants to be less dependant on Ensure and replace with food. Pt states she likes almond milk whip cream; does not prefer ice cream, prefers sherbet, sorbet, and smoothies. Pt states she likes yoga, to run long distances, nanny, babysit, and cook. Pt states she used to swim in high school; year-round swimming. Pt states she likes almond milk taste rather than regular milk taste. Will have Lactaid when having dairy products such as yogurt. Not a sweets person, prefers savory. Planning to attend [employer] in the fall.    Growth Metrics:   Median BMI for age: 3321.5 BMI today: 17.81 % median today:  83% Previous growth data: weight/age  ~50th %; height/age at 25th %; BMI/age N/A Goal rate of weight gain:  0.5-1.0/week  Eating history: Length of time: Jan-Feb 2019 [facility] Goals for RD meetings: improve menstrual period, cold tolerance, normalize  food  Weight history:   Current weight: 100.1 (+1.8 lbs since last week appt 98.3 on 07/02) Highest weight: 145   Lowest weight: 85 Most consistent weight: 105  What would you like to weigh: N/A How has weight changed in the past year: loss of 50 lbs  Medical Information:  Changes in hair, skin, nails since ED started: no Chewing/swallowing difficulties: no  Reflux or heartburn: no Trouble with teeth: no LMP without the use of hormones: Dec 2018, some spotting since 05/06    Weight at that point: 145  Effect of exercise on menses: decreased cycle  Constipation, diarrhea: no, has BM 1-2x daily  Dizziness/lightheadedness: no Headaches/body aches: no Heart racing/chest pain: no Mood: good Sleep: pretty good, sleeps 7-8 hrs/night Focus/concentration: good Cold intolerance: yes, improving Vision changes: no  Mental health diagnosis: AN   Dietary assessment: A typical day consists of 3 meals and 3 snacks  Safe foods include: eggs, chicken, Malawiturkey, ham, fish, seafood, roast beef, all vegetables, all fruit, sweet potatoes, butternut squash, yogurt, soy milk, almond milk, cheerios   Avoided foods include: gluten, dairy  24 hour recall:  B: 4 eggs + cheerios + apple + 1 c coffee creamer + Ensure + water S: 1 packet almond butter + apple + banana + 1 c coffee + almond milk L: roasted Malawiturkey + grapes + carrots + 2 sweet potatoes + water + 1 c coffee + creamer  S: Ensure + chocolate + Lara bar  D: italian chicken breast + sweet potato +  broccoli + hummus + Ensure + 1 c coffee + water + coconut shavings  S: bowl of blueberries + chocolate soy milk   Beverage: coffee, chocolate soy milk, water, tea, Ensure  Physical activity: walking 30 min once/week, intro to Pilates class 30 min   What Methods Do You Use To Control Your Weight (Compensatory behaviors)?           Restricting (calories, fat, carbs)  Exercise - running  Estimated energy intake: ~3000+ kcal  Estimated energy  needs: 2400 kcal 300 g CHO 120 g pro 80 g fat  Nutrition Diagnosis: NI-1.4 Inadequate energy intake As related to disordered eating.  As evidenced by weight loss and amenorrhea.  Intervention/Goals: Discussed the importance of support of eating disorder facility outside of outpatient treatment. Discussed staying motivated during the process. Encouraged pt to continue eating throughout the day and having 3 Ensures/day.    Meal plan:    3 meals    3 snacks  Monitoring and Evaluation: Patient will follow up in 1 week.

## 2019-06-13 NOTE — Patient Instructions (Signed)
It was great seeing you today!  Remember that your fluid intake goal is 70 ounces daily.

## 2019-06-13 NOTE — Progress Notes (Signed)
THIS RECORD MAY CONTAIN CONFIDENTIAL INFORMATION THAT SHOULD NOT BE RELEASED WITHOUT REVIEW OF THE SERVICE PROVIDER.  Adolescent Medicine Consultation Follow-Up Visit CHALEE HIROTA  is a 18 y.o. female  Who is following up today regarding her disordered eating (anorexia nervosa) and recent hospitalization for polydipsia and bradycardia over the past few days. The records from her hospitalization were reviewed for this visit.   Pertinent Labs? No Growth Chart Viewed? yes   History was provided by the patient.   Interpreter? no  Chief Complaint  Patient presents with  . Follow-up    DE w/o EVS    HPI:    Liza reports that she felt that the hosptialization was a little bit helpful-- it let her slow down and help her body recover, in her opinion. She reports that she has been feeling fine since leaving the hospital and has not had any headaches, light headedness, fainting, dizziness, fatigue, CP or tightness, SOB, vomiting, diarrhea, or abdominal pain. Overall, she reports that she is doing well. She reports that she is trying to limit to 70 ounces of water daily. Has had 20 ounces water so far today.   Overall, she describes her mood as "okay". She denies feeling depressed or anxious, and she denies SI/HI and thoughts of self injury. Reports compliance with her zyprexa.  She recently trialed  Prozac, but it made her nauseous and gave her headache. No family history of SSRI use that she is aware of.  We spent a lot of time talking about the next steps in treatment for her. She says that she thinks the next steps are a residential treatment facility, which she would prefer to be [facility]. [facility] (2nd choice) in [place] Sharyn Lull sent an email to them) and [facility] in [facility]  (3rd choice; pt emailed them).are also being considered. She has an intake assessment over the phone tomorrow for [facility]. ( She will need a copy of labs on MyChart.) Overall, she states that her readiness to go to a  residential facility is 6/10 (with 10 being absolutely necessary to get admitted). She is worried about her 3 admissions over the past few months, though states that how well she feels makes her think a residential option isn't as necessary.    Of note, she saw the dietitian earlier today and will see [facility] for therapy later this afternoon. 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  ROS: negative except where noted above  My Chart Activated?   yes   No LMP recorded. Allergies  Allergen Reactions  . Gluten Meal Other (See Comments)    Stomach Pain  . Lactose Intolerance (Gi) Diarrhea   Current Outpatient Medications on File Prior to Visit  Medication Sig Dispense Refill  . feeding supplement, ENSURE ENLIVE, (ENSURE ENLIVE) LIQD Take 237 mLs by mouth as needed (if meal goal not met). (Patient not taking: Reported on 06/07/2019) 237 mL 12  . hydrOXYzine (ATARAX/VISTARIL) 10 MG tablet Take 1 tablet (10 mg total) by mouth 3 (three) times daily as needed. (Patient not taking: Reported on 06/07/2019) 30 tablet 0  . Multiple Vitamin (MULTIVITAMIN WITH MINERALS) TABS tablet Take 1 tablet by mouth daily. 30 tablet 0  . ZYPREXA 2.5 MG tablet Take 2.5 mg by mouth at bedtime.     No current facility-administered medications on file prior to visit.     Patient Active Problem List   Diagnosis Date Noted  . Polyuria   . Bradycardia 06/07/2019  .  Macrocytosis   . Hypophosphatemia 08/09/2018  . Eating disorder 08/08/2018  . Moderate protein-calorie malnutrition ([place]) 08/08/2018  . Chronic bilateral low back pain without sciatica 02/05/2018    Confidentiality was discussed with the patient and if applicable, with caregiver as well.  Changes at home or school since last visit: No, still planning on attending [employer] in the fall    Suicidal or homicidal thoughts?   no Self injurious behaviors?  no   The following portions of the patient's  history were reviewed and updated as appropriate: current medications, past family history, past medical history, past social history and problem list.  Physical Exam:  Vitals:   06/13/19 0938  BP: 111/73  Pulse: 70  Weight: 99 lb (44.9 kg)  Height: 5' 2.84" (1.596 m)   BP 111/73   Pulse 70   Ht 5' 2.84" (1.596 m)   Wt 99 lb (44.9 kg)   BMI 17.63 kg/m  Body mass index: body mass index is 17.63 kg/m. Blood pressure percentiles are not available for patients who are 18 years or older.   Physical Exam Vitals signs and nursing note reviewed.  Constitutional:      General: She is not in acute distress.    Appearance: Normal appearance. She is not ill-appearing.     Comments: Very thin in appearance  HENT:     Head:     Comments: No lanugo noted    Nose: Nose normal. No congestion.     Mouth/Throat:     Mouth: Mucous membranes are dry.     Pharynx: No oropharyngeal exudate or posterior oropharyngeal erythema.     Comments: No tooth discoloration Eyes:     General:        Right eye: No discharge.        Left eye: No discharge.     Conjunctiva/sclera: Conjunctivae normal.  Neck:     Musculoskeletal: Normal range of motion and neck supple. No muscular tenderness.     Comments: No parotid enlargement Cardiovascular:     Rate and Rhythm: Bradycardia present.     Pulses: Normal pulses.     Heart sounds: No murmur.     Comments: HR 56 on auscultation Skin:    General: Skin is warm.     Capillary Refill: Capillary refill takes less than 2 seconds.     Comments: + carotinemia on bilateral hands, which are cool. Hands are dry.   Neurological:     Mental Status: She is alert.  Psychiatric:     Comments: Patient initially talkative, though became more withdrawn when talking about need for inpatient therapy     Results for orders placed or performed in visit on 06/13/19 (from the past 24 hour(s))  POCT Urinalysis Dipstick     Status: Abnormal   Collection Time: 06/13/19  9:41  AM  Result Value Ref Range   Color, UA light yellow    Clarity, UA     Glucose, UA Negative Negative   Bilirubin, UA neg    Ketones, UA neg    Spec Grav, UA <=1.005 (A) 1.010 - 1.025   Blood, UA neg    pH, UA 8.5 (A) 5.0 - 8.0   Protein, UA Negative Negative   Urobilinogen, UA negative (A) 0.2 or 1.0 E.U./dL   Nitrite, UA neg    Leukocytes, UA Negative Negative   Appearance     Odor       Assessment/Plan: JEANNETTE MADDY is a 18 y.o. female  with a history of disordered eating who presents for hospital follow up for the polydypsia and bradycardia. She would really benefit from a residential eating disorder clinic at this time, though she still remains hesitant to commit fully. Fortunately, she (and the Education officer, museum) have made steps towards getting her plugged in, and she has an intake appt with [facility] (her preferred location) tomorrow. In the interim, she is amenable to starting another SSRI to see if this helps her disordered eating and anxiety. She will need repeat labs today to check urine spec grav and her electrolytes in light of her recent admission. Plan as follows:   1. Anorexia nervosa, restricting type - continue [facility] therapy and dietitian visits, with weekly office visits - up 3lbs today  - continue Zyprexa - start Zoloft at 68m daily. Will consider escalation over next 1-2 weeks. Side effects and black box warning reviewed.  - sertraline (ZOLOFT) 25 MG tablet; Take 1 tablet (25 mg total) by mouth daily.  Dispense: 30 tablet; Refill: 1  2. Screening for genitourinary condition 3. Diuresis - repeat BMP today, limit water intake to 70 oz  4. Polydipsia - has endo follow up at the end of this month - reviewed 70oz daily limit of water intake  - POC UA with low spec grav this AM, will send to lab for more accurate testing - BMP to assess lytes - anticipate weekly UA checks for now   - Basic metabolic panel - Urine Microscopic  Teach back used: yes  Medication card  used: no  Follow-up:  No follow-ups on file.   Medical decision-making:  >20 minutes spent face to face with patient with more than 50% of appointment spent discussing diagnosis, management, follow-up, and reviewing of recent hospitalization   ZRenee Rival MD

## 2019-06-13 NOTE — BH Specialist Note (Signed)
Integrated Behavioral Health via Telemedicine Video Visit  06/13/2019 Angela [name] [id number]  Number of [place] visits: 10th Session Start time: 12:03 PM   Session End time: 12:31 PM  Total time: 28 minutes  Referring Provider: Hoyt Koch, NP Type of Visit: Video Patient/Family location: Home Angela [name] Provider location: In office at [name] All persons participating in visit: Patient and Angela [name]  Confirmed patient's address: Yes  Confirmed patient's phone number: Yes  Any changes to demographics: No   Confirmed patient's insurance: Yes  Any changes to patient's insurance: No   Discussed confidentiality: Yes   I connected with Angela [name] and/or Angela [name] patient by a video enabled telemedicine application and verified that I am speaking with the correct person using two identifiers.     I discussed the limitations of evaluation and management by telemedicine and the availability of in person appointments.  I discussed that the purpose of this visit is to provide behavioral health care while limiting exposure to the novel coronavirus.   Discussed there is a possibility of technology failure and discussed alternative modes of communication if that failure occurs.  I discussed that engaging in this video visit, they consent to the provision of behavioral healthcare and the services will be billed under their insurance.  Patient and/or legal guardian expressed understanding and consented to video visit: Yes   PRESENTING CONCERNS: Patient and/or family reports the following symptoms/concerns: eating disorder Duration of problem: Ongoing years; Severity of problem: severe  STRENGTHS (Protective Factors/Coping Skills): Willing to participate  GOALS ADDRESSED: Patient will: 1.  Reduce symptoms of: disordered eating  2.  Increase knowledge and/or ability of: healthy habits and self-management skills  3.  Demonstrate ability to: Increase adequate  support systems for patient/family and Increase motivation to adhere to plan of care  INTERVENTIONS: Interventions utilized:  Solution-Focused Strategies, Behavioral Activation, Supportive Counseling and Psychoeducation and/or Health Education Standardized Assessments completed: Not Needed  ASSESSMENT: Patient currently experiencing need for residential care at Angela [name], recent hospitalization and low HR.   Patient may benefit from residential care.  PLAN: 1. Follow up with behavioral health clinician on : 7/15 for supportive check in. 2. Behavioral recommendations: See above 3. Referral(s): Residential Care  I discussed the assessment and treatment plan with the patient and/or parent/guardian. They were provided an opportunity to ask questions and all were answered. They agreed with the plan and demonstrated an understanding of the instructions.   They were advised to call back or seek an in-person evaluation if the symptoms worsen or if the condition fails to improve as anticipated.  Angela [name]

## 2019-06-14 ENCOUNTER — Encounter: Payer: Self-pay | Admitting: Family

## 2019-06-14 ENCOUNTER — Ambulatory Visit: Payer: Self-pay | Admitting: Registered"

## 2019-06-14 LAB — BASIC METABOLIC PANEL
BUN/Creatinine Ratio: 39 (calc) — ABNORMAL HIGH (ref 6–22)
BUN: 22 mg/dL — ABNORMAL HIGH (ref 7–20)
CO2: 32 mmol/L (ref 20–32)
Calcium: 9.9 mg/dL (ref 8.9–10.4)
Chloride: 97 mmol/L — ABNORMAL LOW (ref 98–110)
Creat: 0.57 mg/dL (ref 0.50–1.00)
Glucose, Bld: 75 mg/dL (ref 65–99)
Potassium: 4.1 mmol/L (ref 3.8–5.1)
Sodium: 138 mmol/L (ref 135–146)

## 2019-06-14 NOTE — Progress Notes (Signed)
Supervising Provider Co-Signature  I reviewed with the resident the medical history and the resident's findings on physical examination.  I discussed with the resident the patient's diagnosis and concur with the treatment plan as documented in the resident's note. I am consulting with Migdalia Dk, FNP-C in Endo when her labs return. Also will continue with admission process for [facility].   Parthenia Ames, NP

## 2019-06-15 ENCOUNTER — Encounter: Payer: Self-pay | Admitting: Family

## 2019-06-19 ENCOUNTER — Ambulatory Visit: Payer: No Typology Code available for payment source | Admitting: Registered"

## 2019-06-20 ENCOUNTER — Ambulatory Visit: Payer: Self-pay | Admitting: Licensed Clinical Social Worker

## 2019-06-20 ENCOUNTER — Ambulatory Visit: Payer: No Typology Code available for payment source | Admitting: Licensed Clinical Social Worker

## 2019-06-21 ENCOUNTER — Ambulatory Visit: Payer: No Typology Code available for payment source | Admitting: Family

## 2019-06-21 ENCOUNTER — Ambulatory Visit: Payer: Self-pay | Admitting: Registered"

## 2019-06-28 ENCOUNTER — Ambulatory Visit: Payer: Self-pay | Admitting: Registered"

## 2019-07-03 ENCOUNTER — Ambulatory Visit (INDEPENDENT_AMBULATORY_CARE_PROVIDER_SITE_OTHER): Payer: Self-pay | Admitting: Pediatric Endocrinology

## 2019-07-04 ENCOUNTER — Ambulatory Visit (INDEPENDENT_AMBULATORY_CARE_PROVIDER_SITE_OTHER): Payer: Self-pay | Admitting: Pediatrics

## 2019-07-10 ENCOUNTER — Telehealth: Payer: Self-pay | Admitting: Family

## 2019-07-10 NOTE — Telephone Encounter (Signed)
Judson Roch from the [facility] called and stated that this patient had a Bone Dentisty scheduled for tomorrow and they had to call the patient and cancel the appointment. The radiologist explained to them they do not do bone dentisty on patients 21 and under. Please give Judson Roch a call with any questions or concerns at [phone] ext. [phone]. Thanks

## 2019-07-11 ENCOUNTER — Other Ambulatory Visit: Payer: Self-pay

## 2019-07-18 ENCOUNTER — Encounter: Payer: Self-pay | Admitting: Family

## 2019-07-25 ENCOUNTER — Ambulatory Visit (INDEPENDENT_AMBULATORY_CARE_PROVIDER_SITE_OTHER): Payer: No Typology Code available for payment source | Admitting: Family

## 2019-07-25 ENCOUNTER — Other Ambulatory Visit: Payer: Self-pay

## 2019-07-25 DIAGNOSIS — F4329 Adjustment disorder with other symptoms: Secondary | ICD-10-CM

## 2019-07-25 DIAGNOSIS — F50019 Anorexia nervosa, restricting type, unspecified: Secondary | ICD-10-CM

## 2019-07-25 DIAGNOSIS — F5001 Anorexia nervosa, restricting type: Secondary | ICD-10-CM

## 2019-07-25 NOTE — Progress Notes (Signed)
Virtual Visit via Video Note  I connected with Angela [name]  on 07/25/19 at 10:00 AM EDT by a video enabled telemedicine application and verified that I am speaking with the correct person using two identifiers.   Location of patient/parent: outside    I discussed the limitations of evaluation and management by telemedicine and the availability of in person appointments.  I discussed that the purpose of this telehealth visit is to provide medical care while limiting exposure to the novel coronavirus.  The patient expressed understanding and agreed to proceed.  Reason for visit:  Anorexia nervosa, restricting type  History of Present Illness:  -really wasn't that bad residential program  -there was a guy there who was a lot of fun and he also swam too  -got home Thursday  -waiting until she goes to [facility] ([place] [facility], 3 nights per week)  -moving to [place] on 31st  -never started taking sertraline, hydroxyzine didn't need it  -taking multivitamin, with Ca and Vit D -no si/hi, no cutting    Observations/Objective:  -standing outside, not walking -well-appearing, NAD, no WOB, no pallor or carotinemia visible on video  Assessment and Plan:  1. Anorexia nervosa, restricting type -return for RN visit for weight check and vitals  -continue with plan  -will plan to see her again right after her move   2. Adjustment disorder with other symptom -stable, continue with plan   Follow Up Instructions: schedule [facility] intake with RN within a week and then video with me again before the move.     I discussed the assessment and treatment plan with the patient and/or parent/guardian. They were provided an opportunity to ask questions and all were answered. They agreed with the plan and demonstrated an understanding of the instructions.   They were advised to call back or seek an in-person evaluation in the emergency room if the symptoms worsen or if the condition fails to improve as  anticipated.  I spent 15 minutes on this telehealth visit inclusive of face-to-face video and care coordination time I was located remote during this encounter.  Parthenia Ames, NP

## 2019-07-30 ENCOUNTER — Ambulatory Visit (INDEPENDENT_AMBULATORY_CARE_PROVIDER_SITE_OTHER): Payer: No Typology Code available for payment source

## 2019-07-30 ENCOUNTER — Encounter: Payer: Self-pay | Admitting: Family

## 2019-07-30 ENCOUNTER — Ambulatory Visit (INDEPENDENT_AMBULATORY_CARE_PROVIDER_SITE_OTHER): Payer: No Typology Code available for payment source | Admitting: Sports Medicine

## 2019-07-30 ENCOUNTER — Encounter: Payer: Self-pay | Admitting: Sports Medicine

## 2019-07-30 ENCOUNTER — Other Ambulatory Visit: Payer: Self-pay

## 2019-07-30 ENCOUNTER — Encounter: Payer: No Typology Code available for payment source | Admitting: Sports Medicine

## 2019-07-30 DIAGNOSIS — M79672 Pain in left foot: Secondary | ICD-10-CM

## 2019-07-30 DIAGNOSIS — B079 Viral wart, unspecified: Secondary | ICD-10-CM | POA: Diagnosis not present

## 2019-07-30 NOTE — Progress Notes (Signed)
Subjective:    CC: Left foot pain  HPI:  Angela [name] is a pleasant 18 year old female, she has a history of anorexia nervosa.  She tends to run to keep her weight down.  She traditionally runs over 40 miles per week.  More recently she started to have pain on the medial and lateral calcaneus, worse with running.  Localized without radiation.  She has cut down her running to approximately 30 miles per week.  She does tend to hide her running, she told her mother that she fell off of her bike.  Her body mass index is in the normal range today.  I reviewed the past medical history, family history, social history, surgical history, and allergies today and no changes were needed.  Please see the problem list section below in epic for further details.  Past Medical History: Past Medical History:  Diagnosis Date  . Anorexia   . Bradycardia    Past Surgical History: No past surgical history on file. Social History: Social History   Socioeconomic History  . Marital status: Single    Spouse name: Not on file  . Number of children: Not on file  . Years of education: Not on file  . Highest education level: Not on file  Occupational History  . Not on file  Social Needs  . Financial resource strain: Patient refused  . Food insecurity    Worry: Patient refused    Inability: Patient refused  . Transportation needs    Medical: Patient refused    Non-medical: Patient refused  Tobacco Use  . Smoking status: Never Smoker  . Smokeless tobacco: Never Used  Substance and Sexual Activity  . Alcohol use: No    Frequency: Never  . Drug use: No  . Sexual activity: Never  Lifestyle  . Physical activity    Days per week: Patient refused    Minutes per session: Patient refused  . Stress: Not on file  Relationships  . Social Musicianconnections    Talks on phone: Patient refused    Gets together: Patient refused    Attends religious service: Patient refused    Active member of club or organization:  Patient refused    Attends meetings of clubs or organizations: Patient refused    Relationship status: Patient refused  Other Topics Concern  . Not on file  Social History Narrative  . Not on file   Family History: Family History  Problem Relation Age of Onset  . Hyperlipidemia Mother   . Hyperlipidemia Maternal Grandmother   . Heart disease Paternal Grandfather    Allergies: Allergies  Allergen Reactions  . Gluten Meal Other (See Comments)    Stomach Pain  . Lactose Intolerance (Gi) Diarrhea   Medications: See med rec.  Review of Systems: No headache, visual changes, nausea, vomiting, diarrhea, constipation, dizziness, abdominal pain, skin rash, fevers, chills, night sweats, swollen lymph nodes, weight loss, chest pain, body aches, joint swelling, muscle aches, shortness of breath, mood changes, visual or auditory hallucinations.  Objective:    General: Well Developed, well nourished, and in no acute distress.  Neuro: Alert and oriented x3, extra-ocular muscles intact, sensation grossly intact.  HEENT: Normocephalic, atraumatic, pupils equal round reactive to light, neck supple, no masses, no lymphadenopathy, thyroid nonpalpable.  Skin: Warm and dry, no rashes noted.  Verruca on the right thigh. Cardiac: Regular rate and rhythm, no murmurs rubs or gallops.  Respiratory: Clear to auscultation bilaterally. Not using accessory muscles, speaking in full sentences.  Abdominal: Soft,  nontender, nondistended, positive bowel sounds, no masses, no organomegaly.  Left foot: No visible erythema or swelling. Range of motion is full in all directions. Strength is 5/5 in all directions. No hallux valgus. No pes cavus or pes planus. No abnormal callus noted. No pain over the navicular prominence, or base of fifth metatarsal. No tenderness to palpation of the calcaneal insertion of plantar fascia. No pain at the Achilles insertion. No pain over the calcaneal bursa. There is tenderness  on the medial and lateral aspects of her calcaneus with a positive calcaneal squeeze test No pain of the retrocalcaneal bursa. No tenderness to palpation over the tarsals, metatarsals, or phalanges. No hallux rigidus or limitus. No tenderness palpation over interphalangeal joints. No pain with compression of the metatarsal heads. Neurovascularly intact distally.  Procedure:  Cryodestruction of right thigh verruca Consent obtained and verified. Time-out conducted. Noted no overlying erythema, induration, or other signs of local infection. Completed without difficulty using Cryo-Gun. Advised to call if fevers/chills, erythema, induration, drainage, or persistent bleeding.  Impression and Recommendations:    The patient was counselled, risk factors were discussed, anticipatory guidance given.  Intractable left heel pain Suspect calcaneal stress injury, she had an x-ray at [facility] that was negative. I am going to obtain an MRI today, if this confirms a calcaneal stress injury she will need to stop running, she can cross train lifting weights. I would bring her back to place her in a cast for forced compliance if stress fracture confirmed. If no stress fracture we will refer her to Dr. Raeford Razor for custom molded orthotics, I would probably need to cut her mileage in half for a couple of weeks.  Wart right thigh Aggressive cryotherapy as above.   ___________________________________________ Gwen Her. Dianah Field, M.D., ABFM., CAQSM. Primary Care and Sports Medicine [facility]  Adjunct Professor of [employer] [place] [employer]

## 2019-07-30 NOTE — Assessment & Plan Note (Signed)
Aggressive cryotherapy as above.

## 2019-07-30 NOTE — Assessment & Plan Note (Addendum)
Suspect calcaneal stress injury, she had an x-ray at [facility] that was negative. I am going to obtain an MRI today, if this confirms a calcaneal stress injury she will need to stop running, she can cross train lifting weights. I would bring her back to place her in a cast for forced compliance if stress fracture confirmed. If no stress fracture we will refer her to Dr. Raeford Razor for custom molded orthotics, I would probably need to cut her mileage in half for a couple of weeks.

## 2019-07-31 ENCOUNTER — Ambulatory Visit: Payer: No Typology Code available for payment source

## 2019-07-31 ENCOUNTER — Encounter: Payer: Self-pay | Admitting: Sports Medicine

## 2019-07-31 DIAGNOSIS — M79672 Pain in left foot: Secondary | ICD-10-CM

## 2019-08-01 ENCOUNTER — Ambulatory Visit: Payer: No Typology Code available for payment source | Admitting: Sports Medicine

## 2019-08-02 ENCOUNTER — Ambulatory Visit (INDEPENDENT_AMBULATORY_CARE_PROVIDER_SITE_OTHER): Payer: No Typology Code available for payment source | Admitting: Family Medicine

## 2019-08-02 ENCOUNTER — Encounter: Payer: Self-pay | Admitting: Family Medicine

## 2019-08-02 ENCOUNTER — Other Ambulatory Visit: Payer: Self-pay

## 2019-08-02 DIAGNOSIS — M79672 Pain in left foot: Secondary | ICD-10-CM | POA: Diagnosis not present

## 2019-08-02 MED ORDER — MELOXICAM 15 MG PO TABS: ORAL_TABLET | ORAL | 3 refills | Status: DC

## 2019-08-02 NOTE — Addendum Note (Signed)
Addended by: Silverio Decamp on: 08/02/2019 12:02 PM   Modules accepted: Orders

## 2019-08-02 NOTE — Assessment & Plan Note (Signed)
MRI did not show a stress fracture. Has some changes of the achilles.  - orthotic  - heel lifts provided. 5/16" placed. Also provided 3/16" heel lift and counseled on their use.  - counseled on HEP and supportive care

## 2019-08-02 NOTE — Progress Notes (Signed)
Angela [name] - 18 y.o. female MRN [id number]  Date of birth: [birth year]  SUBJECTIVE:  Including CC & ROS.  Chief Complaint  Patient presents with  . Foot Orthotics    SYRIAH [name] is a 18 y.o. female that is presenting with left heel pain.  The pain is been ongoing for a few weeks.  She tends to run between 30 and 50 miles per week.  She feels the pain intermittently.  Denies a history of stress fracture.  MRI of the heel did not demonstrate of stress fracture.    Review of Systems  Constitutional: Negative for fever.  HENT: Negative for congestion.   Respiratory: Negative for cough.   Cardiovascular: Negative for chest pain.  Gastrointestinal: Negative for abdominal pain.  Musculoskeletal: Negative for back pain.  Neurological: Negative for weakness.  Hematological: Negative for adenopathy.    HISTORY: Past Medical, Surgical, Social, and Family History Reviewed & Updated per EMR.   Pertinent Historical Findings include:  Past Medical History:  Diagnosis Date  . Anorexia   . Bradycardia     No past surgical history on file.  Allergies  Allergen Reactions  . Gluten Meal Other (See Comments)    Stomach Pain  . Lactose Intolerance (Gi) Diarrhea    Family History  Problem Relation Age of Onset  . Hyperlipidemia Mother   . Hyperlipidemia Maternal Grandmother   . Heart disease Paternal Grandfather      Social History   Socioeconomic History  . Marital status: Single    Spouse name: Not on file  . Number of children: Not on file  . Years of education: Not on file  . Highest education level: Not on file  Occupational History  . Not on file  Social Needs  . Financial resource strain: Patient refused  . Food insecurity    Worry: Patient refused    Inability: Patient refused  . Transportation needs    Medical: Patient refused    Non-medical: Patient refused  Tobacco Use  . Smoking status: Never Smoker  . Smokeless tobacco: Never Used  Substance and  Sexual Activity  . Alcohol use: No    Frequency: Never  . Drug use: No  . Sexual activity: Never  Lifestyle  . Physical activity    Days per week: Patient refused    Minutes per session: Patient refused  . Stress: Not on file  Relationships  . Social Herbalist on phone: Patient refused    Gets together: Patient refused    Attends religious service: Patient refused    Active member of club or organization: Patient refused    Attends meetings of clubs or organizations: Patient refused    Relationship status: Patient refused  . Intimate partner violence    Fear of current or ex partner: Patient refused    Emotionally abused: Patient refused    Physically abused: Patient refused    Forced sexual activity: Patient refused  Other Topics Concern  . Not on file  Social History Narrative  . Not on file     PHYSICAL EXAM:  VS: BP (!) 98/58   Ht 5\' 2"  (1.575 m)   Wt 105 lb (47.6 kg)   BMI 19.20 kg/m  Physical Exam Gen: NAD, alert, cooperative with exam, well-appearing ENT: normal lips, normal nasal mucosa,  Eye: normal EOM, normal conjunctiva and lids CV:  no edema, +2 pedal pulses   Resp: no accessory muscle use, non-labored,   Skin: no rashes, no  areas of induration  Neuro: normal tone, normal sensation to touch Psych:  normal insight, alert and oriented MSK:  Left ankle/foot:  Mild haglund's of the achilles  Mild TTp at the insertion of the achilles  Mild TTP of the plantar calcaneous  Normal ankle ROM   NVI.   Patient was fitted for a standard, cushioned, semi-rigid orthotic. The orthotic was heated and afterward the patient stood on the orthotic blank positioned on the orthotic stand. The patient was positioned in subtalar neutral position and 10 degrees of ankle dorsiflexion in a weight bearing stance. After completion of molding, a stable base was applied to the orthotic blank. The blank was ground to a stable position for weight bearing. Size: size 6  men's trimmed  Base: Blue EVA Additional Posting and Padding: 5/16" heel lift placed The patient ambulated these, and they were very comfortable.    ASSESSMENT & PLAN:   Intractable left heel pain MRI did not show a stress fracture. Has some changes of the achilles.  - orthotic  - heel lifts provided. 5/16" placed. Also provided 3/16" heel lift and counseled on their use.  - counseled on HEP and supportive care

## 2019-08-07 ENCOUNTER — Ambulatory Visit: Payer: No Typology Code available for payment source

## 2019-08-17 ENCOUNTER — Encounter: Payer: Self-pay | Admitting: Sports Medicine

## 2019-08-20 ENCOUNTER — Other Ambulatory Visit: Payer: Self-pay

## 2019-08-20 ENCOUNTER — Ambulatory Visit (INDEPENDENT_AMBULATORY_CARE_PROVIDER_SITE_OTHER): Payer: No Typology Code available for payment source

## 2019-08-20 ENCOUNTER — Encounter: Payer: Self-pay | Admitting: Sports Medicine

## 2019-08-20 ENCOUNTER — Ambulatory Visit (INDEPENDENT_AMBULATORY_CARE_PROVIDER_SITE_OTHER): Payer: No Typology Code available for payment source | Admitting: Sports Medicine

## 2019-08-20 DIAGNOSIS — E440 Moderate protein-calorie malnutrition: Secondary | ICD-10-CM | POA: Diagnosis not present

## 2019-08-20 DIAGNOSIS — D7589 Other specified diseases of blood and blood-forming organs: Secondary | ICD-10-CM

## 2019-08-20 DIAGNOSIS — M25562 Pain in left knee: Secondary | ICD-10-CM

## 2019-08-20 DIAGNOSIS — M79672 Pain in left foot: Secondary | ICD-10-CM | POA: Diagnosis not present

## 2019-08-20 DIAGNOSIS — M25462 Effusion, left knee: Secondary | ICD-10-CM

## 2019-08-20 MED ORDER — CALCIUM CARBONATE-VITAMIN D 600-400 MG-UNIT PO TABS: 1.0000 | ORAL_TABLET | Freq: Two times a day (BID) | ORAL | 11 refills | Status: DC

## 2019-08-20 NOTE — Assessment & Plan Note (Signed)
MRI was normal, no stress injury, expected findings in runners. This is completely resolved now.

## 2019-08-20 NOTE — Assessment & Plan Note (Addendum)
Swollen left knee, just distal to the medial tibial plateau. I do suspect she has a proximal tibial stress injury. Certainly pes anserine bursitis can present similarly. She is going to stop running for 2 full weeks. Cross train on the bike. I would like x-rays today, no MRI per her request. Adding hip rehab exercises, her hip abductors on the left are extremely weak.

## 2019-08-20 NOTE — Patient Instructions (Signed)
Hip Rehabilitation Protocol:  1.  Side leg raises.  3x30 with no weight, then 3x15 with 2 lb ankle weight, then 3x15 with 5 lb ankle weight 2.  Standing hip rotation.  3x30 with no weight, then 3x15 with 2 lb ankle weight, then 3x15 with 5 lb ankle weight. 3.  Side step ups.  3x30 with no weight, then 3x15 with 5 lbs in backpack, then 3x15 with 10 lbs in backpack. 

## 2019-08-20 NOTE — Telephone Encounter (Signed)
Called pt and added on for today

## 2019-08-20 NOTE — Progress Notes (Addendum)
Subjective:    CC: Follow-up  HPI: This is a pleasant 18 year old female, she is a runner, we treated her for intractable heel pain at the last visit which has resolved with custom orthotics, meloxicam.  Unfortunately over the past couple weeks she has developed pain in her left knee, medial aspect just below the joint line.  She is currently running 30 to 40 miles per week.  Pain is moderate, persistent, localized without radiation, no trauma, no mechanical symptoms.  Weight loss: Angela [name] has had some difficulty with anorexic eating, she has lost some weight and this has led to several orthopedic injuries.  She is agreeable to address this aggressively at this visit and a follow-up visit.  I reviewed the past medical history, family history, social history, surgical history, and allergies today and no changes were needed.  Please see the problem list section below in epic for further details.  Past Medical History: Past Medical History:  Diagnosis Date  . Anorexia   . Bradycardia    Past Surgical History: No past surgical history on file. Social History: Social History   Socioeconomic History  . Marital status: Single    Spouse name: Not on file  . Number of children: Not on file  . Years of education: Not on file  . Highest education level: Not on file  Occupational History  . Not on file  Social Needs  . Financial resource strain: Patient refused  . Food insecurity    Worry: Patient refused    Inability: Patient refused  . Transportation needs    Medical: Patient refused    Non-medical: Patient refused  Tobacco Use  . Smoking status: Never Smoker  . Smokeless tobacco: Never Used  Substance and Sexual Activity  . Alcohol use: No    Frequency: Never  . Drug use: No  . Sexual activity: Never  Lifestyle  . Physical activity    Days per week: Patient refused    Minutes per session: Patient refused  . Stress: Not on file  Relationships  . Social Clinical research associate on phone: Patient refused    Gets together: Patient refused    Attends religious service: Patient refused    Active member of club or organization: Patient refused    Attends meetings of clubs or organizations: Patient refused    Relationship status: Patient refused  Other Topics Concern  . Not on file  Social History Narrative  . Not on file   Family History: Family History  Problem Relation Age of Onset  . Hyperlipidemia Mother   . Hyperlipidemia Maternal Grandmother   . Heart disease Paternal Grandfather    Allergies: Allergies  Allergen Reactions  . Gluten Meal Other (See Comments)    Stomach Pain  . Lactose Intolerance (Gi) Diarrhea   Medications: See med rec.  Review of Systems: No fevers, chills, night sweats, weight loss, chest pain, or shortness of breath.   Objective:    General: Well Developed, well nourished, and in no acute distress.  Neuro: Alert and oriented x3, extra-ocular muscles intact, sensation grossly intact.  HEENT: Normocephalic, atraumatic, pupils equal round reactive to light, neck supple, no masses, no lymphadenopathy, thyroid nonpalpable.  Skin: Warm and dry, no rashes. Cardiac: Regular rate and rhythm, no murmurs rubs or gallops, no lower extremity edema.  Respiratory: Clear to auscultation bilaterally. Not using accessory muscles, speaking in full sentences. Left knee: Visibly swollen along the medial joint line, with tenderness just distal to the tibial plateau medial. ROM  normal in flexion and extension and lower leg rotation. Ligaments with solid consistent endpoints including ACL, PCL, LCL, MCL. Negative Mcmurray's and provocative meniscal tests. Non painful patellar compression. Patellar and quadriceps tendons unremarkable. Hamstring and quadriceps strength is normal. Hip abductors are very weak on the left.  Impression and Recommendations:    Pain and swelling of left knee Swollen left knee, just distal to the medial tibial  plateau. I do suspect she has a proximal tibial stress injury. Certainly pes anserine bursitis can present similarly. She is going to stop running for 2 full weeks. Cross train on the bike. I would like x-rays today, no MRI per her request. Adding hip rehab exercises, her hip abductors on the left are extremely weak.   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  Intractable left heel pain MRI was normal, no stress injury, expected findings in runners. This is completely resolved now.  Macrocytosis Adding B12, folate levels.   ___________________________________________ Ihor Austinhomas J. Benjamin Stainhekkekandam, M.D., ABFM., CAQSM. Primary Care and Sports Medicine [facility]  Adjunct Professor of Family Medicine  [employer]

## 2019-08-20 NOTE — Assessment & Plan Note (Signed)
I am also going to check labs as I do think she is developing the female athletic triad, we did discuss that if her weight continues to drop that we restart Remeron to help her gain some weight and she was open to this idea.

## 2019-08-21 NOTE — Addendum Note (Signed)
Addended by: Silverio Decamp on: 08/21/2019 08:50 AM   Modules accepted: Orders

## 2019-08-21 NOTE — Assessment & Plan Note (Signed)
Adding B12, folate levels.

## 2019-08-22 LAB — COMPLETE METABOLIC PANEL WITH GFR
AG Ratio: 2.4 (calc) (ref 1.0–2.5)
ALT: 22 U/L (ref 5–32)
AST: 26 U/L (ref 12–32)
Albumin: 4.6 g/dL (ref 3.6–5.1)
Alkaline phosphatase (APISO): 61 U/L (ref 36–128)
BUN/Creatinine Ratio: 27 (calc) — ABNORMAL HIGH (ref 6–22)
BUN: 28 mg/dL — ABNORMAL HIGH (ref 7–20)
CO2: 30 mmol/L (ref 20–32)
Calcium: 9.9 mg/dL (ref 8.9–10.4)
Chloride: 103 mmol/L (ref 98–110)
Creat: 1.05 mg/dL — ABNORMAL HIGH (ref 0.50–1.00)
GFR, Est African American: 89 mL/min/{1.73_m2} (ref >=60)
GFR, Est Non African American: 77 mL/min/{1.73_m2} (ref >=60)
Globulin: 1.9 g/dL (calc) — ABNORMAL LOW (ref 2.0–3.8)
Glucose, Bld: 82 mg/dL (ref 65–99)
Potassium: 4.6 mmol/L (ref 3.8–5.1)
Sodium: 141 mmol/L (ref 135–146)
Total Bilirubin: 0.4 mg/dL (ref 0.2–1.1)
Total Protein: 6.5 g/dL (ref 6.3–8.2)

## 2019-08-22 LAB — CBC
HCT: 35.7 % (ref 35.0–45.0)
Hemoglobin: 12 g/dL (ref 11.7–15.5)
MCH: 34.1 pg — ABNORMAL HIGH (ref 27.0–33.0)
MCHC: 33.6 g/dL (ref 32.0–36.0)
MCV: 101.4 fL — ABNORMAL HIGH (ref 80.0–100.0)
MPV: 11.1 fL (ref 7.5–12.5)
Platelets: 244 10*3/uL (ref 140–400)
RBC: 3.52 10*6/uL — ABNORMAL LOW (ref 3.80–5.10)
RDW: 11.5 % (ref 11.0–15.0)
WBC: 6.5 10*3/uL (ref 3.8–10.8)

## 2019-08-22 LAB — FOLATE: Folate: >24 ng/mL

## 2019-08-22 LAB — TEST AUTHORIZATION

## 2019-08-22 LAB — VITAMIN B12: Vitamin B-12: >2000 pg/mL — ABNORMAL HIGH (ref 200–1100)

## 2019-08-22 LAB — VITAMIN D 25 HYDROXY (VIT D DEFICIENCY, FRACTURES): Vit D, 25-Hydroxy: 45 ng/mL (ref 30–100)

## 2019-08-22 LAB — TSH: TSH: 0.9 mIU/L

## 2019-08-22 LAB — PREALBUMIN: Prealbumin: 26 mg/dL (ref 17–34)

## 2020-06-05 IMAGING — MR MR OF THE LEFT HEEL WITHOUT CONTRAST
6 series · 40 of 40 positions shown · non-contrast
Comparison: None.

CLINICAL DATA: Heel pain

EXAM:
MR OF THE LEFT HEEL WITHOUT CONTRAST
TECHNIQUE: Multiplanar, multisequence MR imaging of the left hindfoot was
performed. No intravenous contrast was administered.

[Series 6: PD fat-sat · axial · 3.0mm · 0.59mm/px · z∈[-30,+99]mm · 7 of 40 slices shown]
[im 1/40]
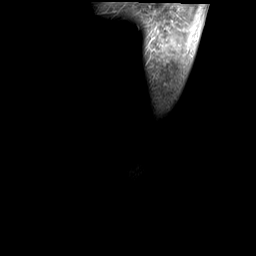
[im 7/40]
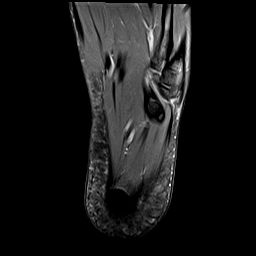
[im 14/40]
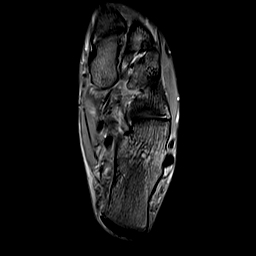
[im 20/40]
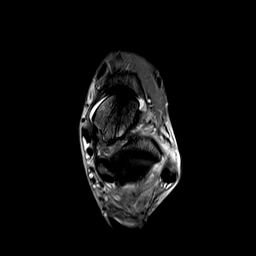
[im 27/40]
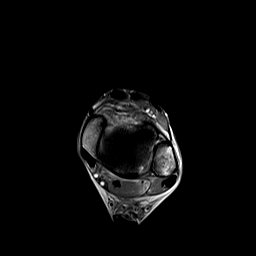
[im 33/40]
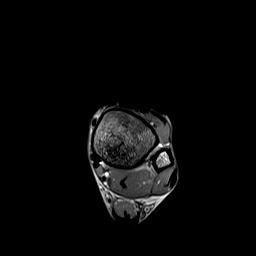
[im 40/40]
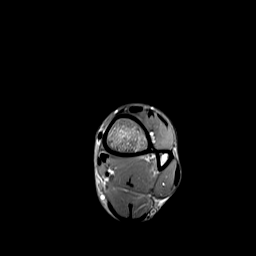

[Series 7: T2 fat-sat · coronal · 3.0mm · 0.59mm/px · 8 of 42 slices shown (1 of 2)]
[im 1/42]
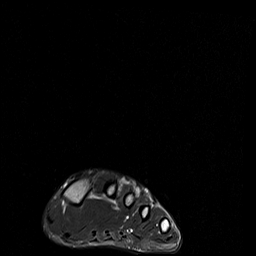
[im 6/42]
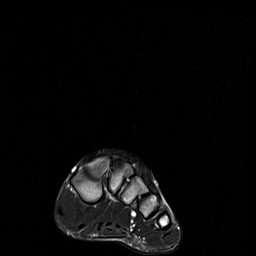
[im 12/42]
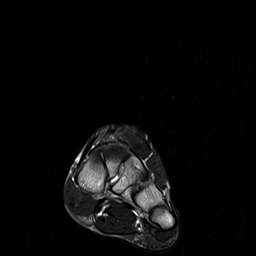
[im 18/42]
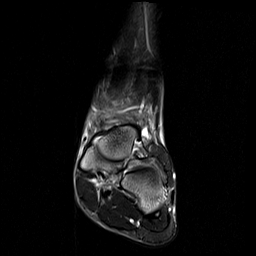
[im 24/42]
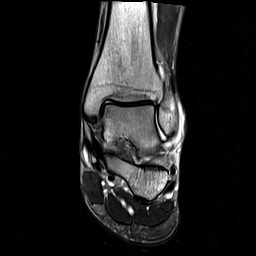
[im 30/42]
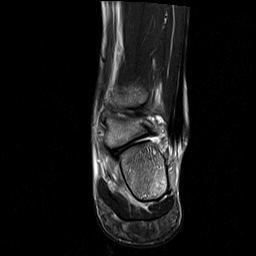
[im 36/42]
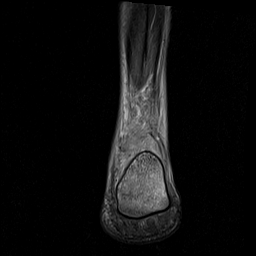
[im 42/42]
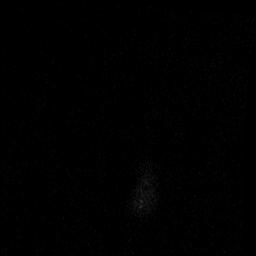

[Series 8: T2 fat-sat · axial · 3.0mm · 0.59mm/px · z∈[-30,+99]mm · 7 of 40 slices shown (2 of 2)]
[im 1/40]
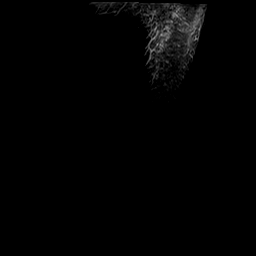
[im 7/40]
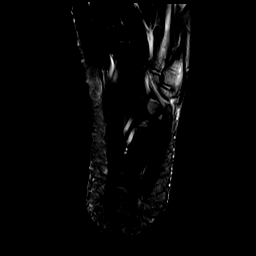
[im 14/40]
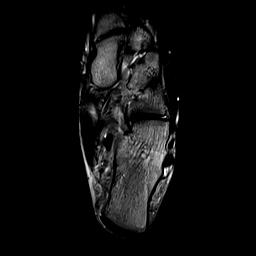
[im 20/40]
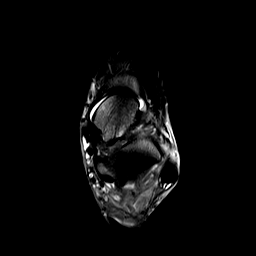
[im 27/40]
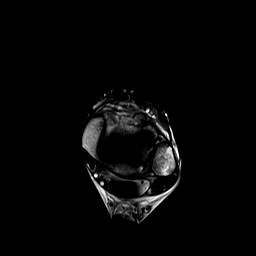
[im 33/40]
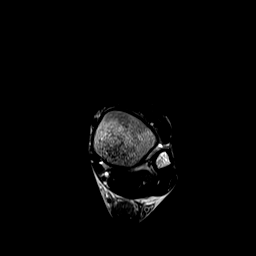
[im 40/40]
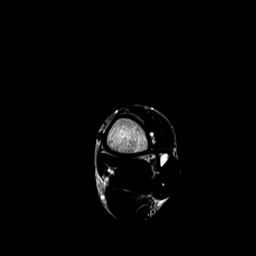

[Series 9: T1 · sagittal · 3.0mm · 0.47mm/px · 5 of 26 slices shown]
[im 1/26]
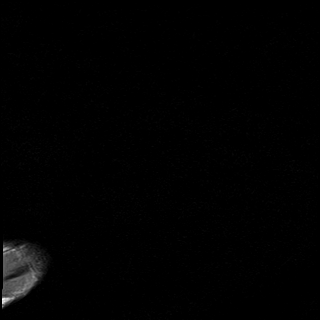
[im 7/26]
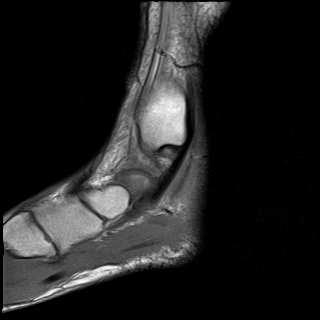
[im 13/26]
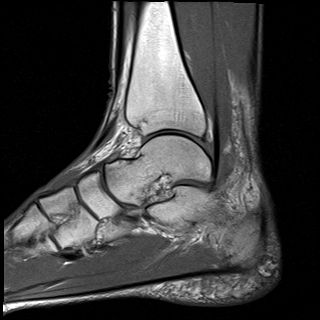
[im 19/26]
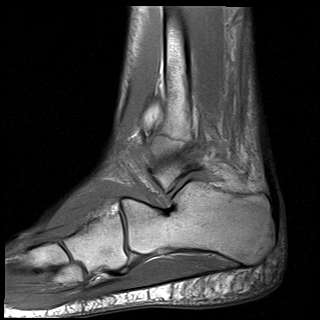
[im 26/26]
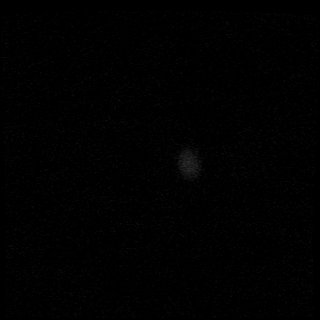

[Series 10: STIR · sagittal · 3.0mm · 0.59mm/px · 5 of 25 slices shown (1 of 2)]
[im 1/25]
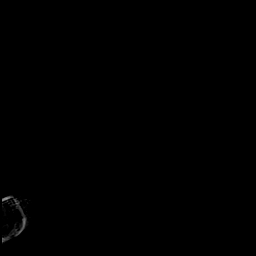
[im 7/25]
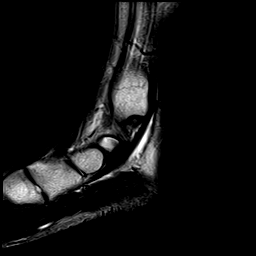
[im 13/25]
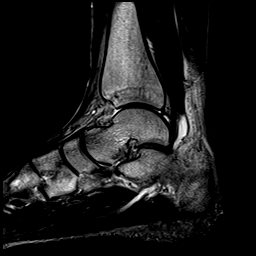
[im 19/25]
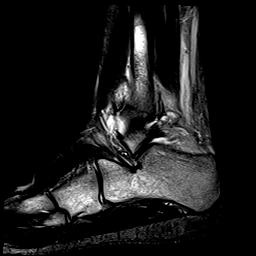
[im 25/25]
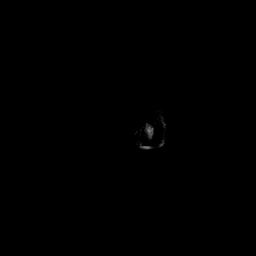

[Series 11: STIR · coronal · 3.0mm · 0.59mm/px · 8 of 42 slices shown (2 of 2)]
[im 1/42]
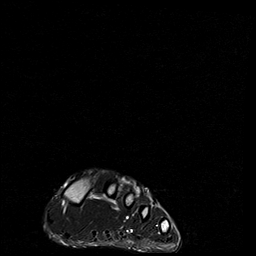
[im 6/42]
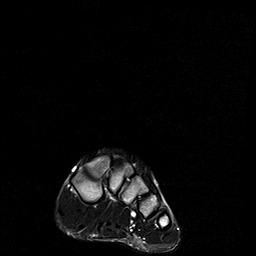
[im 12/42]
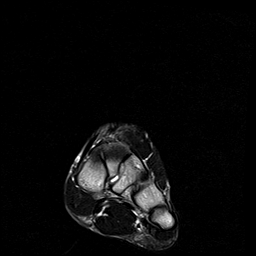
[im 18/42]
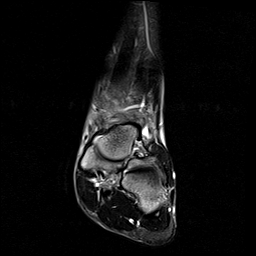
[im 24/42]
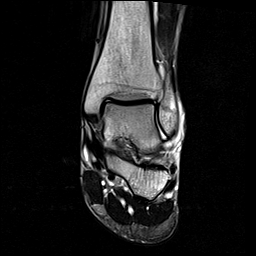
[im 30/42]
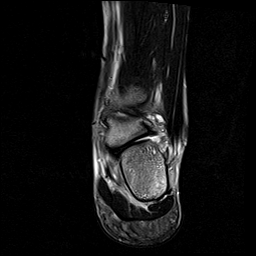
[im 36/42]
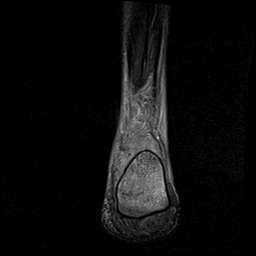
[im 42/42]
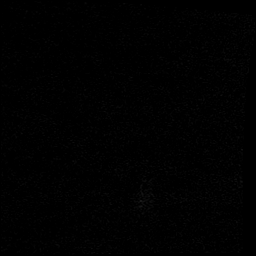

[40 of 40 positions shown; findings below may reference images not displayed]

FINDINGS: TENDONS

Peroneal: Intact peroneus longus and peroneus brevis tendons. Mild
amount of fluid within the peroneal tendon sheath.

Posteromedial: Intact tibialis posterior, flexor hallucis longus and
flexor digitorum longus tendons. Mild amount of fluid within the
tibialis posterior, flexor digitorum longus, and flexor hallucis
longus tendon sheaths.

Anterior: Intact tibialis anterior, extensor hallucis longus and
extensor digitorum longus tendons.

Achilles: Intact.

Plantar Fascia: Intact.

LIGAMENTS

Lateral: Anterior and posterior tibiofibular ligaments are intact.
The anterior and posterior talofibular as well as the
calcaneofibular ligaments are also intact.

Medial: Intact deltoid and spring ligaments.

CARTILAGE

Ankle Joint: No joint effusion or chondral defect.

Subtalar Joints/Sinus Tarsi: No joint effusion or chondral defect.

Bones: No marrow signal abnormality. No fracture or dislocation.

Other: Trace fluid within the retrocalcaneal bursa. There is mild
diffuse stranding within Kager's fat and the remaining visualize
subcutaneous fat about the ankle. No soft tissue fluid collection or
hematoma
IMPRESSION: 1. No calcaneal stress fracture or other acute osseous abnormality,
as clinically questioned.
2. Trace fluid within the retrocalcaneal bursa which could reflect a
mild bursitis.
3. Nonspecific mild tenosynovitis involving the peroneal tendons,
tibialis posterior tendon, MANGARAJA, and FHL tendons.

## 2021-06-23 ENCOUNTER — Other Ambulatory Visit: Payer: Self-pay

## 2021-06-23 ENCOUNTER — Encounter: Payer: Self-pay | Admitting: Medical-Surgical

## 2021-06-23 ENCOUNTER — Ambulatory Visit (INDEPENDENT_AMBULATORY_CARE_PROVIDER_SITE_OTHER): Payer: No Typology Code available for payment source

## 2021-06-23 ENCOUNTER — Ambulatory Visit (INDEPENDENT_AMBULATORY_CARE_PROVIDER_SITE_OTHER): Payer: No Typology Code available for payment source | Admitting: Medical-Surgical

## 2021-06-23 VITALS — BP 100/64 | HR 92 | Temp 98.1°F | Ht 63.0 in | Wt 124.9 lb

## 2021-06-23 DIAGNOSIS — R140 Abdominal distension (gaseous): Secondary | ICD-10-CM

## 2021-06-23 DIAGNOSIS — Z136 Encounter for screening for cardiovascular disorders: Secondary | ICD-10-CM

## 2021-06-23 DIAGNOSIS — Z1329 Encounter for screening for other suspected endocrine disorder: Secondary | ICD-10-CM | POA: Diagnosis not present

## 2021-06-23 DIAGNOSIS — Z114 Encounter for screening for human immunodeficiency virus [HIV]: Secondary | ICD-10-CM

## 2021-06-23 DIAGNOSIS — Z7689 Persons encountering health services in other specified circumstances: Secondary | ICD-10-CM | POA: Diagnosis not present

## 2021-06-23 DIAGNOSIS — K5900 Constipation, unspecified: Secondary | ICD-10-CM

## 2021-06-23 DIAGNOSIS — Z0000 Encounter for general adult medical examination without abnormal findings: Secondary | ICD-10-CM | POA: Diagnosis not present

## 2021-06-23 DIAGNOSIS — Z1159 Encounter for screening for other viral diseases: Secondary | ICD-10-CM | POA: Diagnosis not present

## 2021-06-23 DIAGNOSIS — Z13220 Encounter for screening for lipoid disorders: Secondary | ICD-10-CM

## 2021-06-23 DIAGNOSIS — E538 Deficiency of other specified B group vitamins: Secondary | ICD-10-CM

## 2021-06-23 MED ORDER — LUBIPROSTONE 8 MCG PO CAPS: 8.0000 ug | ORAL_CAPSULE | Freq: Two times a day (BID) | ORAL | 0 refills | Status: DC

## 2021-06-23 NOTE — Progress Notes (Signed)
New Patient Office Visit  Subjective:  Patient ID: Angela [name], female    DOB: [birth year]  Age: 20 y.o. MRN: [id number]  CC:  Chief Complaint  Patient presents with   Establish Care    HPI [name] presents to establish care.  Anorexia-she does have a history of this but notes that she is doing much better these days.  She does eat 3 meals a day as well as snacks intermittently.  She is drinking plenty of water and has reduced her activity level to a normal regimen.  She does have a long history of chronic constipation and has flares of GI issues.  For the last 3 to 4 months that she has been chronically constipated and significantly bloated.  She has not had a bowel movement in the past 3 weeks and when she does have bowel movements they are hard balls that are very difficult to pass.  She has to strain with attempts at bowel movements and has noted that she does have some tissue that protrudes from her rectum when she is doing this.  The protrusions do resolve after she is done having a bowel movement but this is very concerning for her.  When she gets bloated and cannot go to the bathroom, she has some pretty significant lower abdominal pain.  She sometimes passes mucus rather than stool and has noted that there are times when mucus does leak out causing skin irritation and breakdown around the rectum.  She will intermittently have a little nausea but has not had any further vomiting.  She notes that there is often undigested food in her stool as well as blood on the tissue when she wipes.  She does have a history of laxative use including Colace, mag citrate, MiraLAX, Dulcolax, various enemas, and suppositories that are available over-the-counter.  She has had no relief with any of these and often takes multiple doses in the same day, greater than the recommended dosage and safe maximum.  She has not taken any prescription medications for constipation and has not been evaluated  by GI.  Past Medical History:  Diagnosis Date   Anorexia    Bradycardia     History reviewed. No pertinent surgical history.  Family History  Problem Relation Age of Onset   Hyperlipidemia Mother    Hyperlipidemia Maternal Grandmother    Heart disease Paternal Grandfather     Social History   Socioeconomic History   Marital status: Single    Spouse name: Not on file   Number of children: Not on file   Years of education: Not on file   Highest education level: Not on file  Occupational History   Not on file  Tobacco Use   Smoking status: Never   Smokeless tobacco: Never  Vaping Use   Vaping Use: Never used  Substance and Sexual Activity   Alcohol use: No   Drug use: Never   Sexual activity: Never  Other Topics Concern   Not on file  Social History Narrative   Not on file   Social Determinants of Health   Financial Resource Strain: Not on file  Food Insecurity: Not on file  Transportation Needs: Not on file  Physical Activity: Not on file  Stress: Not on file  Social Connections: Not on file  Intimate Partner Violence: Not on file    ROS Review of Systems  Constitutional:  Negative for chills, fatigue, fever and unexpected weight change.  Respiratory:  Negative for  cough, chest tightness, shortness of breath and wheezing.   Cardiovascular:  Negative for chest pain, palpitations and leg swelling.  Gastrointestinal:  Positive for abdominal distention, abdominal pain, constipation, nausea and rectal pain. Negative for diarrhea and vomiting.  Neurological:  Negative for dizziness, light-headedness and headaches.  Psychiatric/Behavioral:  Negative for dysphoric mood, self-injury, sleep disturbance and suicidal ideas. The patient is not nervous/anxious.    Objective:   Today's Vitals: BP 100/64   Pulse 92   Temp 98.1 F (36.7 C)   Ht $R'5\' 3"'Br$  (1.6 m)   Wt 124 lb 14.4 oz (56.7 kg)   SpO2 99%   BMI 22.13 kg/m   Physical Exam Vitals reviewed.   Constitutional:      General: She is not in acute distress.    Appearance: Normal appearance.  HENT:     Head: Normocephalic and atraumatic.  Cardiovascular:     Rate and Rhythm: Normal rate and regular rhythm.     Pulses: Normal pulses.     Heart sounds: Normal heart sounds. No murmur heard.   No friction rub. No gallop.  Pulmonary:     Effort: Pulmonary effort is normal. No respiratory distress.     Breath sounds: Normal breath sounds. No wheezing.  Abdominal:     General: Bowel sounds are normal. There is distension (BLQ).     Palpations: There is no mass.     Tenderness: There is abdominal tenderness (BLQ). There is no guarding or rebound.     Hernia: No hernia is present.  Skin:    General: Skin is warm and dry.  Neurological:     Mental Status: She is alert and oriented to person, place, and time.  Psychiatric:        Mood and Affect: Mood normal.        Behavior: Behavior normal.        Thought Content: Thought content normal.        Judgment: Judgment normal.    Assessment & Plan:   1. Encounter to establish care Reviewed available information and discussed healthcare concerns with patient.  2. Preventative health care Checking CBC with differential, CMP, and lipid panel. - Lipid panel - COMPLETE METABOLIC PANEL WITH GFR - CBC with Differential/Platelet  3. Screening for endocrine disorder Checking TSH - TSH  4. Need for hepatitis C screening test Reviewed screening recommendations.  Low risk so deferring today.  5. Screening for HIV (human immunodeficiency virus) Reviewed screening recommendations.  Low risk so deferring today.  6. Vitamin B12 deficiency Checking vitamin B12.  - Vitamin B12  7. Constipation, unspecified constipation type 8. Abdominal distention Abdominal x-ray today.  Would like to get further imaging with CT abdomen pelvis after review of abdominal x-ray findings. - DG Abd 2 Views; Future - CT Abdomen Pelvis Wo Contrast;  Future  Outpatient Encounter Medications as of 06/23/2021  Medication Sig   Calcium Carbonate-Vitamin D 600-400 MG-UNIT tablet Take 1 tablet by mouth 2 (two) times daily.   lubiprostone (AMITIZA) 8 MCG capsule Take 1 capsule (8 mcg total) by mouth 2 (two) times daily with a meal.   Multiple Vitamin (MULTIVITAMIN WITH MINERALS) TABS tablet Take 1 tablet by mouth daily.   Probiotic Product (PROBIOTIC PO) Take by mouth daily.   [DISCONTINUED] feeding supplement, ENSURE ENLIVE, (ENSURE ENLIVE) LIQD Take 237 mLs by mouth as needed (if meal goal not met). (Patient not taking: Reported on 06/07/2019)   [DISCONTINUED] hydrOXYzine (ATARAX/VISTARIL) 10 MG tablet Take 1 tablet (10 mg  total) by mouth 3 (three) times daily as needed. (Patient not taking: Reported on 06/07/2019)   [DISCONTINUED] meloxicam (MOBIC) 15 MG tablet One tab PO qAM with breakfast for 2 weeks, then daily prn pain.   [DISCONTINUED] sertraline (ZOLOFT) 25 MG tablet Take 1 tablet (25 mg total) by mouth daily. (Patient not taking: Reported on 07/30/2019)   [DISCONTINUED] ZYPREXA 2.5 MG tablet Take 2.5 mg by mouth at bedtime.   No facility-administered encounter medications on file as of 06/23/2021.    Follow-up: Return in about 4 weeks (around 07/21/2021) for constipation follow up.   Clearnce Sorrel, DNP, APRN, FNP-BC [facility]

## 2021-06-24 ENCOUNTER — Encounter: Payer: Self-pay | Admitting: Medical-Surgical

## 2021-06-24 NOTE — Telephone Encounter (Signed)
Please let her know that she'll need to try linzess prior to approval of amitiza.  If she is ok with this then I will send this over.

## 2021-06-25 MED ORDER — LINACLOTIDE 145 MCG PO CAPS: 145.0000 ug | ORAL_CAPSULE | Freq: Every day | ORAL | 0 refills | Status: DC

## 2021-06-25 NOTE — Telephone Encounter (Signed)
Forwarding to PCP.

## 2021-06-26 ENCOUNTER — Encounter: Payer: Self-pay | Admitting: Medical-Surgical

## 2021-06-26 ENCOUNTER — Other Ambulatory Visit: Payer: Self-pay

## 2021-06-26 ENCOUNTER — Ambulatory Visit (INDEPENDENT_AMBULATORY_CARE_PROVIDER_SITE_OTHER): Payer: No Typology Code available for payment source

## 2021-06-26 DIAGNOSIS — K5900 Constipation, unspecified: Secondary | ICD-10-CM

## 2021-06-26 DIAGNOSIS — R140 Abdominal distension (gaseous): Secondary | ICD-10-CM

## 2021-06-26 DIAGNOSIS — R1030 Lower abdominal pain, unspecified: Secondary | ICD-10-CM | POA: Diagnosis not present

## 2021-06-29 ENCOUNTER — Other Ambulatory Visit: Payer: Self-pay

## 2021-06-29 ENCOUNTER — Telehealth: Payer: Self-pay

## 2021-06-29 NOTE — Telephone Encounter (Signed)
Sample bottle of Linzess (4 doses) signed out and placed at the front desk for patient to pick up. If message is not viewed today, please call her to let her know about the sample.   Thayer Ohm, DNP, APRN, FNP-BC [facility]

## 2021-06-29 NOTE — Telephone Encounter (Signed)
PA submitted for Linzess, awaiting response from insurance company.

## 2021-06-29 NOTE — Progress Notes (Signed)
[facility] notified of [employer] approval.

## 2021-06-29 NOTE — Telephone Encounter (Signed)
PA for Linzess 145 mg approved 06/29/21

## 2021-06-29 NOTE — Telephone Encounter (Signed)
PA for Linzess approved.  

## 2021-07-20 ENCOUNTER — Other Ambulatory Visit: Payer: Self-pay | Admitting: Medical-Surgical

## 2021-07-20 MED ORDER — LINACLOTIDE 290 MCG PO CAPS: 290.0000 ug | ORAL_CAPSULE | Freq: Every day | ORAL | 1 refills | Status: DC

## 2021-07-21 ENCOUNTER — Encounter: Payer: Self-pay | Admitting: Medical-Surgical

## 2021-07-21 ENCOUNTER — Ambulatory Visit (INDEPENDENT_AMBULATORY_CARE_PROVIDER_SITE_OTHER): Payer: No Typology Code available for payment source | Admitting: Medical-Surgical

## 2021-07-21 ENCOUNTER — Other Ambulatory Visit: Payer: Self-pay

## 2021-07-21 VITALS — BP 112/72 | HR 84 | Resp 20 | Wt 124.0 lb

## 2021-07-21 DIAGNOSIS — K5900 Constipation, unspecified: Secondary | ICD-10-CM

## 2021-07-21 MED ORDER — LINACLOTIDE 290 MCG PO CAPS: 290.0000 ug | ORAL_CAPSULE | Freq: Every day | ORAL | 1 refills | Status: DC

## 2021-07-21 NOTE — Progress Notes (Deleted)
    Subjective:    CC: knee pain  I, Angela [name], LAT, ATC, am serving as scribe for Dr. Clementeen Graham.  HPI: Pt is a 19 y/o female presenting w/ c/o R/L knee pain x .  She locates her pain to .  Knee swelling: Knee mechanical symptoms: Aggravating factors: Treatments tried:  Diagnostic imaging: R and L knee XR- 08/20/19  Pertinent review of Systems: ***  Relevant historical information: ***   Objective:   There were no vitals filed for this visit. General: Well Developed, well nourished, and in no acute distress.   MSK: ***  Lab and Radiology Results No results found for this or any previous visit (from the past 72 hour(s)). No results found.    Impression and Recommendations:    Assessment and Plan: 20 y.o. female with ***.  PDMP not reviewed this encounter. No orders of the defined types were placed in this encounter.  No orders of the defined types were placed in this encounter.   Discussed warning signs or symptoms. Please see discharge instructions. Patient expresses understanding.   ***

## 2021-07-21 NOTE — Progress Notes (Signed)
  HPI with pertinent ROS:   CC: constipation follow up  HPI: Very pleasant 20 year old female presenting today for follow-up on constipation.  She was originally started on Linzess 145 mcg daily but this was ineffective.  We doubled her dose to the maximum of 290 mcg daily and she has been taking this as prescribed, tolerating well without side effects.  Notes that she has seen an improvement in her bowel movements.  Her current pattern is 1 small to medium bowel movement every morning, still passing small hard balls but only has to strain sometimes.  Did have 1 episode of having a small amount of blood on the toilet tissue but this was after having some itching in the area that she attributed to a small break in skin after one of her bowel movements.  Has increased her dietary fiber and water intake and is currently taking fiber Gummies.  Is fully denies fever, chills, bloating, excessive flatulence, nausea, vomiting, diarrhea, hematochezia, hematemesis, and melena.  She has a new patient appointment with GI scheduled for 8/24.  I reviewed the past medical history, family history, social history, surgical history, and allergies today and no changes were needed.  Please see the problem list section below in epic for further details.   Physical exam:   General: Well Developed, well nourished, and in no acute distress.  Neuro: Alert and oriented x3.  HEENT: Normocephalic, atraumatic.  Skin: Warm and dry. Cardiac: Regular rate and rhythm, no murmurs rubs or gallops, no lower extremity edema.  Respiratory: Clear to auscultation bilaterally. Not using accessory muscles, speaking in full sentences. Abdomen: Soft, nontender, nondistended. Bowel sounds + x 4 quadrants. No HSM appreciated.  Impression and Recommendations:    1. Constipation, unspecified constipation type Continue Linzess 290 mcg daily as prescribed.  Continue working to increase dietary fiber and water intake.  Be consistent with  regular exercise and avoid foods that are known to cause constipation.  Patient education provided with AVS regarding self-management of chronic constipation and warning signs to monitor for.  Return in about 6 months (around 01/21/2022) for General follow-up. ___________________________________________ Thayer Ohm, DNP, APRN, FNP-BC [facility]

## 2021-07-21 NOTE — Patient Instructions (Signed)
Chronic Constipation Chronic constipation is a condition in which a person has three or fewer bowel movements a week, for 3 months or longer. This condition is especially commonin older adults. What are the causes? Causes of chronic constipation may include: Not drinking enough fluid, eating enough food or fiber, or getting enough physical activity. Pregnancy. A tear in the anus (anal fissure). Blockage in the bowel (bowel obstruction). Narrowing of the bowel (bowel stricture). Having a long-term medical condition, such as: Diabetes, hypothyroidism, or iron-deficiency anemia. Stroke or spinal cord injury. Multiple sclerosis or Parkinson's disease. Colon cancer. Dementia. Inflammatory bowel disease (IBD), outward collapse of the rectum (rectal prolapse), or hemorrhoids. Taking certain medicines, including: Narcotics. These are a certain type of prescription pain medicine. Antacids or iron supplements. Water pills (diuretics). Certain blood pressure medicines. Anti-seizure medicines. Antidepressants. Medicines for Parkinson's disease. Other causes of this condition may include: Stress. Problems in the nerves and muscles that control the movement of stool. Weak or impaired pelvic floor muscles. What increases the risk? You may be at higher risk for chronic constipation if: You are older than age 70. You are female. You live in a long-term care facility. You have a long-term disease. You have a mental health disorder or eating disorder. What are the signs or symptoms? The main symptom of chronic constipation is having three or fewer bowel movements a week for several weeks. Other signs and symptoms may vary from person to person. These include: Pushing hard (straining) to pass stool, or having hard or lumpy stools. Painful bowel movements. Having lower abdominal discomfort, such as cramps or bloating. Being unable to have a bowel movement when you feel the urge, or feeling like  you still need to pass stool after a bowel movement. Feeling that you have something in your rectum that is blocking or preventing bowel movements. Seeing blood on the toilet paper or in your stool. Worsening confusion (in older adults). How is this diagnosed? This condition may be diagnosed based on: Your symptoms and medical history. You will be asked about your symptoms, lifestyle, diet, and any medicines that you are taking. A physical exam. Your abdomen will be examined. A digital rectal exam may be done. For this exam, a health care provider places a lubricated, gloved finger into the rectum. Tests to check for any underlying causes of your constipation. These may be ordered if you have bleeding in your rectum, weight loss, or a family history of colon cancer. In these cases, you may have: Imaging studies of the colon. These may include X-ray, ultrasound, or a CT scan. Blood tests. A procedure to examine the inside of your colon (colonoscopy). More specialized tests to check: Whether your anal sphincter works well. This is a ring-shaped muscle that controls the closing of the anus. How well food moves through your colon. Tests to measure the nerve signal in your pelvic floor muscles (electromyography). How is this treated? Treatment for chronic constipation depends on the cause. Most often, treatment starts with: Being more active and getting regular exercise. Drinking more fluids. Adding fiber to your diet. Sources of fiber include fruits, vegetables, whole grains, and fiber supplements. Using medicines such as stool softeners or medicines that increase contractions in your digestive system (pro-motility agents). Training your pelvic muscles with biofeedback. Surgery, if there is obstruction. Treatment may also include: Stopping or changing some medicines if they cause constipation. Using a fiber supplement (bulk laxative) or stool softener. Using a prescription laxative. This  works by absorbing water   into your colon (osmotic laxative). You may also need to see a specialist who treats conditions of the digestive system (gastroenterologist). Follow these instructions at home: Medicines Take over-the-counter and prescription medicines only as told by your health care provider. If you are taking a laxative, take it as told by your health care provider. Eating and drinking  Eat a balanced diet that includes enough fiber. Ask your health care provider to recommend a diet that is right for you. Drink clear fluids, especially water. Avoid drinking alcohol, caffeine, and soda. These can make constipation worse. Drink enough fluid to keep your urine pale yellow.  General instructions Get some physical activity every day. Ask your health care provider what activities are safe for you. Get colon cancer screenings as told by your health care provider. Keep all follow-up visits as told by your health care provider. This is important. Contact a health care provider if you have: Three or fewer bowel movements a week. Stools that are hard or lumpy. Blood on the toilet paper or in your stool after you have a bowel movement. Unexplained weight loss. Rectum (rectal) pain. Stool leakage. Nausea or vomiting. Get help right away if you have: Rectal bleeding or you pass blood clots. Severe rectal pain. Body tissue that pushes out (protrudes) from your anus. Severe pain or bloating (distension) in your abdomen. Vomiting that you cannot control. Summary Chronic constipation is a condition in which a person has three or fewer bowel movements a week, for 3 months or longer. You may have a higher risk for this condition if you are an older adult, you are female, or you have a long-term disease. Treatment for this condition depends on the cause. Most treatments for chronic constipation include adding fiber to your diet, drinking more fluids, and getting more physical activity. You may  also need to treat any underlying medical conditions or stop or change certain medicines if they cause constipation. If lifestyle changes do not relieve constipation, your health care provider may recommend taking a laxative. This information is not intended to replace advice given to you by your health care provider. Make sure you discuss any questions you have with your healthcare provider. Document Revised: 10/10/2019 Document Reviewed: 10/10/2019 Elsevier Patient Education  2022 [employer]  

## 2021-07-22 ENCOUNTER — Ambulatory Visit: Payer: No Typology Code available for payment source | Admitting: Family Medicine

## 2021-07-22 LAB — CBC WITH DIFFERENTIAL/PLATELET
Absolute Monocytes: 594 cells/uL (ref 200–950)
Basophils Absolute: 42 cells/uL (ref 0–200)
Basophils Relative: 0.7 %
Eosinophils Absolute: 420 cells/uL (ref 15–500)
Eosinophils Relative: 7 %
HCT: 41.3 % (ref 35.0–45.0)
Hemoglobin: 13.6 g/dL (ref 11.7–15.5)
Lymphs Abs: 708 cells/uL — ABNORMAL LOW (ref 850–3900)
MCH: 32.7 pg (ref 27.0–33.0)
MCHC: 32.9 g/dL (ref 32.0–36.0)
MCV: 99.3 fL (ref 80.0–100.0)
MPV: 10.4 fL (ref 7.5–12.5)
Monocytes Relative: 9.9 %
Neutro Abs: 4236 cells/uL (ref 1500–7800)
Neutrophils Relative %: 70.6 %
Platelets: 265 10*3/uL (ref 140–400)
RBC: 4.16 10*6/uL (ref 3.80–5.10)
RDW: 11.8 % (ref 11.0–15.0)
Total Lymphocyte: 11.8 %
WBC: 6 10*3/uL (ref 3.8–10.8)

## 2021-07-22 LAB — COMPLETE METABOLIC PANEL WITH GFR
AG Ratio: 2.1 (calc) (ref 1.0–2.5)
ALT: 27 U/L (ref 6–29)
AST: 30 U/L (ref 10–30)
Albumin: 4.6 g/dL (ref 3.6–5.1)
Alkaline phosphatase (APISO): 71 U/L (ref 31–125)
BUN/Creatinine Ratio: 43 (calc) — ABNORMAL HIGH (ref 6–22)
BUN: 29 mg/dL — ABNORMAL HIGH (ref 7–25)
CO2: 30 mmol/L (ref 20–32)
Calcium: 9.8 mg/dL (ref 8.6–10.2)
Chloride: 100 mmol/L (ref 98–110)
Creat: 0.68 mg/dL (ref 0.50–0.96)
Globulin: 2.2 g/dL (calc) (ref 1.9–3.7)
Glucose, Bld: 56 mg/dL — ABNORMAL LOW (ref 65–99)
Potassium: 4.1 mmol/L (ref 3.5–5.3)
Sodium: 140 mmol/L (ref 135–146)
Total Bilirubin: 0.4 mg/dL (ref 0.2–1.2)
Total Protein: 6.8 g/dL (ref 6.1–8.1)
eGFR: 128 mL/min/{1.73_m2} (ref >=60)

## 2021-07-22 LAB — LIPID PANEL
Cholesterol: 156 mg/dL (ref <200)
HDL: 58 mg/dL (ref >=50)
LDL Cholesterol (Calc): 81 mg/dL (calc)
Non-HDL Cholesterol (Calc): 98 mg/dL (calc) (ref <130)
Total CHOL/HDL Ratio: 2.7 (calc) (ref <5.0)
Triglycerides: 85 mg/dL (ref <150)

## 2021-07-22 LAB — VITAMIN B12: Vitamin B-12: >2000 pg/mL — ABNORMAL HIGH (ref 200–1100)

## 2021-07-22 LAB — TSH: TSH: 0.49 mIU/L

## 2021-07-29 ENCOUNTER — Ambulatory Visit (INDEPENDENT_AMBULATORY_CARE_PROVIDER_SITE_OTHER): Payer: No Typology Code available for payment source | Admitting: Physician Assistant

## 2021-07-29 ENCOUNTER — Encounter: Payer: Self-pay | Admitting: Physician Assistant

## 2021-07-29 VITALS — BP 100/60 | HR 84 | Ht 63.0 in | Wt 122.0 lb

## 2021-07-29 DIAGNOSIS — K5900 Constipation, unspecified: Secondary | ICD-10-CM | POA: Diagnosis not present

## 2021-07-29 NOTE — Progress Notes (Signed)
Chief Complaint: Constipation  HPI:    Angela [name] is a 20 year old female with a past medical history as listed below including anorexia, who was referred to me by Christen Butter, NP for a complaint of constipation.    06/23/2021 x-ray of the abdomen showed moderate fecal retention consistent with constipation.  No obstruction    06/26/2021 CT the abdomen pelvis without contrast showed a large amount of stool throughout the colon.  No other abnormality.    07/21/2021 patient saw her PCP for follow-up of constipation.  She had been started on 145 mcg of Linzess daily but this is been ineffective so she doubled her dose 290 mcg daily with improvement in her bowel movements.  At that time having 1 small to medium bowel movement every morning.    07/21/2021 CBC, CMP, lipid panel and TSH within normal limits.    Today, the patient presents to clinic and tells me that since starting the Linzess 290 mcg daily she is much improved with a daily bowel movement and only occasional straining.  Tells me that most concerning to her was that this seemed to start "out of the blue".  She moved home in March and around then noticed that she started with worsening constipation, she had always had some issues off and on but typically it was only 1 or 2 weeks out of the year, after she moved home, then started with MiraLAX daily which really did not work.  This is when she sought out care from her PCP.  Tells me that she was eating the same amount of fiber and drinking enough water, but she had decreased her exercise dramatically.    Denies fever, chills, blood in her stool, family history of IBD, abdominal pain or symptoms that awaken her from sleep.  Past Medical History:  Diagnosis Date   Anorexia    Bradycardia     Past Surgical History:  Procedure Laterality Date   NO PAST SURGERIES      Current Outpatient Medications  Medication Sig Dispense Refill   Calcium Carbonate-Vitamin D 600-400 MG-UNIT tablet Take 1  tablet by mouth 2 (two) times daily. 60 tablet 11   linaclotide (LINZESS) 290 MCG CAPS capsule Take 1 capsule (290 mcg total) by mouth daily. 30 capsule 1   Multiple Vitamin (MULTIVITAMIN WITH MINERALS) TABS tablet Take 1 tablet by mouth daily. 30 tablet 0   Probiotic Product (PROBIOTIC PO) Take by mouth daily.     No current facility-administered medications for this visit.    Allergies as of 07/29/2021 - Review Complete 07/29/2021  Allergen Reaction Noted   Gluten meal Nausea And Vomiting 06/07/2019   Lactose intolerance (gi) Diarrhea 06/07/2019    Family History  Problem Relation Age of Onset   Hyperlipidemia Mother    Hyperlipidemia Maternal Grandmother    Heart disease Paternal Grandfather     Social History   Socioeconomic History   Marital status: Single    Spouse name: Not on file   Number of children: Not on file   Years of education: Not on file   Highest education level: Not on file  Occupational History   Not on file  Tobacco Use   Smoking status: Never   Smokeless tobacco: Never  Vaping Use   Vaping Use: Never used  Substance and Sexual Activity   Alcohol use: Never   Drug use: Never   Sexual activity: Never  Other Topics Concern   Not on file  Social History Narrative  Not on file   Social Determinants of Health   Financial Resource Strain: Not on file  Food Insecurity: Not on file  Transportation Needs: Not on file  Physical Activity: Not on file  Stress: Not on file  Social Connections: Not on file  Intimate Partner Violence: Not on file    Review of Systems:    Constitutional: No weight loss, fever or chills Skin: No rash Cardiovascular: No chest pain Respiratory: No SOB Gastrointestinal: See HPI and otherwise negative Genitourinary: No dysuria  Neurological: No headache, dizziness or syncope Musculoskeletal: No new muscle or joint pain Hematologic: No bleeding Psychiatric: No history of depression or anxiety   Physical Exam:   Vital signs: BP 100/60 (BP Location: Left Arm, Patient Position: Sitting, Cuff Size: Normal)   Pulse 84   Ht 5\' 3"  (1.6 m) Comment: height measured without shoes  Wt 122 lb (55.3 kg)   LMP 11/22/2018   BMI 21.61 kg/m    Constitutional:   Pleasant Caucasian female appears to be in NAD, Well developed, Well nourished, alert and cooperative Head:  Normocephalic and atraumatic. Eyes:   PEERL, EOMI. No icterus. Conjunctiva pink. Ears:  Normal auditory acuity. Neck:  Supple Throat: Oral cavity and pharynx without inflammation, swelling or lesion.  Respiratory: Respirations even and unlabored. Lungs clear to auscultation bilaterally.   No wheezes, crackles, or rhonchi.  Cardiovascular: Normal S1, S2. No MRG. Regular rate and rhythm. No peripheral edema, cyanosis or pallor.  Gastrointestinal:  Soft, nondistended, nontender. No rebound or guarding. Normal bowel sounds. No appreciable masses or hepatomegaly. Rectal:  Not performed.  Msk:  Symmetrical without gross deformities. Without edema, no deformity or joint abnormality.  Neurologic:  Alert and  oriented x4;  grossly normal neurologically.  Skin:   Dry and intact without significant lesions or rashes. Psychiatric: Demonstrates good judgement and reason without abnormal affect or behaviors.  RELEVANT LABS AND IMAGING: CBC    Component Value Date/Time   WBC 6.0 07/21/2021 0000   RBC 4.16 07/21/2021 0000   HGB 13.6 07/21/2021 0000   HCT 41.3 07/21/2021 0000   PLT 265 07/21/2021 0000   MCV 99.3 07/21/2021 0000   MCH 32.7 07/21/2021 0000   MCHC 32.9 07/21/2021 0000   RDW 11.8 07/21/2021 0000   LYMPHSABS 708 (L) 07/21/2021 0000   MONOABS 0.4 06/07/2019 1340   EOSABS 420 07/21/2021 0000   BASOSABS 42 07/21/2021 0000    CMP     Component Value Date/Time   NA 140 07/21/2021 0000   K 4.1 07/21/2021 0000   CL 100 07/21/2021 0000   CO2 30 07/21/2021 0000   GLUCOSE 56 (L) 07/21/2021 0000   BUN 29 (H) 07/21/2021 0000   CREATININE  0.68 07/21/2021 0000   CALCIUM 9.8 07/21/2021 0000   PROT 6.8 07/21/2021 0000   ALBUMIN 4.7 06/07/2019 1340   AST 30 07/21/2021 0000   ALT 27 07/21/2021 0000   ALKPHOS 45 06/07/2019 1340   BILITOT 0.4 07/21/2021 0000   GFRNONAA 77 08/20/2019 1450   GFRAA 89 08/20/2019 1450    Assessment: 1.  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  Continue Linzess 290 mcg daily 2.  Discussed with the patient that she has no alarm features.  Offered her a colonoscopy for further evaluation but she declines today. 3.  Discussed irritable bowel syndrome.  Explained that she  could trial going to every other day dosing of Linzess in a few months if she would like to see how her body is doing after she is settled at home, it is perfectly fine to stay on this medication though if it is helping her. 4.  Patient to follow in clinic with [place] as needed.  She was assigned to Dr. Myrtie Neither this morning.  Hyacinth Meeker, PA-C [facility] 07/29/2021, 10:13 AM  Cc: Christen Butter, NP

## 2021-07-29 NOTE — Patient Instructions (Signed)
Continue Linzess 290 mcg daily.   Follow up as needed.  If you are age 21 or older, your body mass index should be between 23-30. Your Body mass index is 21.61 kg/m. If this is out of the aforementioned range listed, please consider follow up with your Primary Care Provider.  If you are age 88 or younger, your body mass index should be between 19-25. Your Body mass index is 21.61 kg/m. If this is out of the aformentioned range listed, please consider follow up with your Primary Care Provider.   __________________________________________________________  The [facility] providers would like to encourage you to use [facility] to communicate with providers for non-urgent requests or questions.  Due to long hold times on the telephone, sending your provider a message by [facility] may be a faster and more efficient way to get a response.  Please allow 48 business hours for a response.  Please remember that this is for non-urgent requests.

## 2021-07-30 ENCOUNTER — Encounter: Payer: No Typology Code available for payment source | Admitting: Obstetrics and Gynecology

## 2021-07-30 NOTE — Progress Notes (Signed)
____________________________________________________________  Attending physician addendum:  Thank you for sending this case to me. I have reviewed the entire note and agree with the plan.  Recent TSH was also normal.  Colonoscopy typically of low yield in young otherwise healthy patient, as this is unlikely to be obstructive in nature.  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  She might only need the Linzess for a few months and then slowly decrease it to see if her bowel habits regulate on their own.  Amada Jupiter, MD  ____________________________________________________________\

## 2021-07-31 ENCOUNTER — Ambulatory Visit: Payer: No Typology Code available for payment source | Admitting: Sports Medicine

## 2021-10-12 ENCOUNTER — Other Ambulatory Visit: Payer: Self-pay | Admitting: Medical-Surgical

## 2021-11-29 ENCOUNTER — Encounter: Payer: Self-pay | Admitting: Medical-Surgical

## 2021-12-10 ENCOUNTER — Ambulatory Visit: Payer: No Typology Code available for payment source | Admitting: Family Medicine

## 2022-03-25 ENCOUNTER — Other Ambulatory Visit: Payer: Self-pay | Admitting: Medical-Surgical

## 2022-04-06 ENCOUNTER — Telehealth: Payer: Self-pay

## 2022-04-06 NOTE — Telephone Encounter (Signed)
Initiated Prior authorization QAS:TMHDQQI capsules ?Via: Covermymeds ?Case/Key:[id number] ?Status: approved  as of 04/06/22 ?Reason:approved through 04/07/2023 ?Notified Pt via: Mychart  ?

## 2022-05-13 ENCOUNTER — Emergency Department (HOSPITAL_COMMUNITY)
Admission: EM | Admit: 2022-05-13 | Discharge: 2022-05-13 | Discharge status: Against Medical Advice | Payer: No Typology Code available for payment source | Attending: Student | Admitting: Student

## 2022-05-13 DIAGNOSIS — S80861A Insect bite (nonvenomous), right lower leg, initial encounter: Secondary | ICD-10-CM | POA: Insufficient documentation

## 2022-05-13 DIAGNOSIS — Z5321 Procedure and treatment not carried out due to patient leaving prior to being seen by health care provider: Secondary | ICD-10-CM | POA: Insufficient documentation

## 2022-05-13 DIAGNOSIS — W57XXXA Bitten or stung by nonvenomous insect and other nonvenomous arthropods, initial encounter: Secondary | ICD-10-CM | POA: Insufficient documentation

## 2022-05-13 NOTE — ED Notes (Signed)
Went to triage patient but reported she didn't want to wait 8 hrs and left

## 2022-05-13 NOTE — ED Provider Triage Note (Signed)
Emergency Medicine Provider Triage Evaluation Note  Angela [name] , a 21 y.o. female  was evaluated in triage.  Pt complains of animal bite to right shin.  Seen in urgent care, tetanus updated and given Augmentin.  Concerned about rabies vaccination status..  Review of Systems  Per HPI  Physical Exam  BP 121/86   Pulse 82   Temp 98.2 F (36.8 C) (Oral)   Resp 16   SpO2 99%  Gen:   Awake, no distress   Resp:  Normal effort  MSK:   Moves extremities without difficulty  Other:  To less than 1 mm laceration to anterior right shin.  Medical Decision Making  Medically screening exam initiated at 7:24 PM.  Appropriate orders placed.  DEON IVEY was informed that the remainder of the evaluation will be completed by another provider, this initial triage assessment does not replace that evaluation, and the importance of remaining in the ED until their evaluation is complete.     Theron Arista, PA-C 05/13/22 1927

## 2022-05-14 DIAGNOSIS — D7589 Other specified diseases of blood and blood-forming organs: Secondary | ICD-10-CM | POA: Insufficient documentation

## 2022-05-17 ENCOUNTER — Telehealth: Payer: Self-pay | Admitting: General Practice

## 2022-05-17 NOTE — Telephone Encounter (Signed)
Transition Care Management Unsuccessful Follow-up Telephone Call  Date of discharge and from where:  05/13/22 from [name]  Attempts:  1st Attempt  Reason for unsuccessful TCM follow-up call:  Voice mail full

## 2022-05-18 NOTE — Telephone Encounter (Signed)
Transition Care Management Unsuccessful Follow-up Telephone Call  Date of discharge and from where:  05/13/22 from [facility]  Attempts:  2nd Attempt  Reason for unsuccessful TCM follow-up call:  Voice mail full

## 2022-05-24 NOTE — Telephone Encounter (Signed)
Transition Care Management Unsuccessful Follow-up Telephone Call  Date of discharge and from where:  05/13/22 from [facility]  Attempts:  3rd Attempt  Reason for unsuccessful TCM follow-up call:  Voice mail full

## 2022-05-31 ENCOUNTER — Encounter: Payer: Self-pay | Admitting: Physician Assistant

## 2022-05-31 ENCOUNTER — Ambulatory Visit (INDEPENDENT_AMBULATORY_CARE_PROVIDER_SITE_OTHER): Payer: Self-pay | Admitting: Physician Assistant

## 2022-05-31 ENCOUNTER — Ambulatory Visit (INDEPENDENT_AMBULATORY_CARE_PROVIDER_SITE_OTHER)
Admission: RE | Admit: 2022-05-31 | Discharge: 2022-05-31 | Disposition: A | Discharge status: Home / Self Care | Payer: Self-pay | Source: Ambulatory Visit | Attending: Physician Assistant | Admitting: Physician Assistant

## 2022-05-31 VITALS — BP 126/80 | HR 102 | Ht 62.0 in | Wt 127.2 lb

## 2022-05-31 DIAGNOSIS — K589 Irritable bowel syndrome without diarrhea: Secondary | ICD-10-CM

## 2022-05-31 DIAGNOSIS — R140 Abdominal distension (gaseous): Secondary | ICD-10-CM

## 2022-05-31 DIAGNOSIS — R143 Flatulence: Secondary | ICD-10-CM

## 2022-06-01 MED ORDER — LUBIPROSTONE 24 MCG PO CAPS: 24.0000 ug | ORAL_CAPSULE | Freq: Two times a day (BID) | ORAL | 3 refills | Status: DC

## 2022-06-09 NOTE — Progress Notes (Signed)
____________________________________________________________  Attending physician addendum:  Thank you for sending this case to me. I have reviewed the entire note and agree with the plan.  If SIBO testing negative or equivocal, she should have celiac antibodies.  Amada Jupiter, MD  ____________________________________________________________

## 2022-06-11 ENCOUNTER — Ambulatory Visit (INDEPENDENT_AMBULATORY_CARE_PROVIDER_SITE_OTHER): Payer: 59 | Admitting: Family

## 2022-06-11 ENCOUNTER — Encounter: Payer: Self-pay | Admitting: Family

## 2022-06-11 VITALS — BP 110/62 | HR 115 | Temp 98.5°F | Ht 62.0 in | Wt 129.1 lb

## 2022-06-11 DIAGNOSIS — R251 Tremor, unspecified: Secondary | ICD-10-CM | POA: Diagnosis not present

## 2022-06-11 DIAGNOSIS — E162 Hypoglycemia, unspecified: Secondary | ICD-10-CM

## 2022-06-11 LAB — POCT CBG (FASTING - GLUCOSE)-MANUAL ENTRY: Glucose Fasting, POC: 89 mg/dL (ref 70–99)

## 2022-06-11 NOTE — Progress Notes (Signed)
New Patient Office Visit  Subjective:  Patient ID: Angela [name], female    DOB: 18-Dec-2000  Age: 21 y.o. MRN: [id number]  CC:  Chief Complaint  Patient presents with   Shaking    Pt c/o feeling shaky, slight fatigue, and a sweet taste in her mouth almost every morning. Pt states she has been drinking coffee for over 7-8 years, every morning.   Urinary Frequency    Pt c/o urinary frequency mostly at night for about a year.    Establish Care    Pt states after she has eaten breakfast her blood sugar was at 55, one a year ago.    HPI Angela [name] presents for establishing care today.  Feeling shaky:  reports feeling shaky and sweaty after eating, most days, but not every day, reports having labs last year fasting and BS was low and she had same sx. Sometimes feels after other meals in the evenings. Also reports a sweet taste in her mouth most mornings and urinary frequency. Denies dizziness, nausea, or increased anxiety. 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   Assessment & Plan:   Problem List Items Addressed This Visit   None Visit Diagnoses     Shaky    -  Primary CBG 89, pt has eaten this am. Advised on preventative measures eating mini meals thru the day, hydrating with 2L of water qd, start an exercise routine. Has some mild tachycardia, advised to monitor her HR at home, if noticing elevations when resting let me know. Also recommend returning for fasting labs.    Hypoglycemia       Relevant Orders   POCT CBG (Fasting - Glucose) (Completed)      Subjective:    Outpatient Medications Prior to Visit  Medication Sig Dispense Refill   lubiprostone (AMITIZA) 24 MCG capsule Take 1 capsule (24 mcg total) by mouth 2 (two) times daily with a meal. 60 capsule 3   Multiple Vitamin (MULTIVITAMIN WITH MINERALS) TABS tablet Take 1 tablet by mouth daily. 30 tablet 0   Probiotic Product (PROBIOTIC PO) Take by mouth daily.     No facility-administered  medications prior to visit.   Past Medical History:  Diagnosis Date   Anorexia    Bradycardia    Past Surgical History:  Procedure Laterality Date   NO PAST SURGERIES      Objective:   Today's Vitals: BP 110/62 (BP Location: Left Arm, Patient Position: Sitting, Cuff Size: Large)   Pulse (!) 115   Temp 98.5 F (36.9 C) (Temporal)   Ht 5\' 2"  (1.575 m)   Wt 129 lb 2 oz (58.6 kg)   LMP 12/04/2021 (Exact Date)   SpO2 98%   BMI 23.62 kg/m   Physical Exam Vitals and nursing note reviewed.  Constitutional:      Appearance: Normal appearance.  Cardiovascular:     Rate and Rhythm: Normal rate and regular rhythm.  Pulmonary:     Effort: Pulmonary effort is normal.     Breath sounds: Normal breath sounds.  Musculoskeletal:        General: Normal range of motion.  Skin:    General: Skin is warm and dry.  Neurological:     Mental Status: She is alert.     Sensory: Sensation is intact.     Motor: Motor function is intact.     Coordination: Coordination is intact.     Gait: Gait is intact.  Psychiatric:  Mood and Affect: Mood normal.        Behavior: Behavior normal.     Dulce Sellar, NP

## 2022-06-11 NOTE — Patient Instructions (Addendum)
Welcome to [facility], It was a pleasure meeting you today!   Remember to not go long periods without eating. Sometimes eating mini meals throughout the day can reduce the chance of your blood sugar dropping too low.  Hydrate with at least 2 liters of water daily and try to exercise for 20 minutes daily getting your heart rate up above 120 bpm.  Please schedule a physical with fasting labs in 2 months today.    PLEASE NOTE: If you had any LAB tests please let us know if you have not heard back within a few days. You may see your results on MyChart before we have a chance to review them but we will give you a call once they are reviewed by [place]. If we ordered any REFERRALS today, please let us know if you have not heard from their office within the next week.  Let us know through MyChart if you are needing REFILLS, or have your pharmacy send [place] the request. You can also use MyChart to communicate with me or any office staff.   Please try these tips to maintain a healthy lifestyle:  Eat most of your calories during the day when you are active. Eliminate processed foods including packaged sweets (pies, cakes, cookies), reduce intake of potatoes, white bread, white pasta, and white rice. Look for whole grain options, oat flour or almond flour.  Each meal should contain half fruits/vegetables, one quarter protein, and one quarter carbs (no bigger than a computer mouse).  Cut down on sweet beverages. This includes juice, soda, and sweet tea. Also watch fruit intake, though this is a healthier sweet option, it still contains natural sugar! Limit to 3 servings daily.  Drink at least 1 8oz. glass of water with each meal and aim for at least 8 glasses per day  Exercise at least 150 minutes every week.

## 2022-06-15 ENCOUNTER — Telehealth: Payer: Self-pay

## 2022-06-15 NOTE — Telephone Encounter (Signed)
Received notification from [employer] that prior authorization for LUBIPROSTONE has been APPROVED  Key: [id number]  PA Case ID: [id number]  Rx #: [id number] Coverage ends 06/02/23

## 2022-06-29 ENCOUNTER — Other Ambulatory Visit: Payer: Self-pay

## 2022-06-29 MED ORDER — TRULANCE 3 MG PO TABS: 1.0000 | ORAL_TABLET | Freq: Every day | ORAL | 2 refills | Status: DC

## 2022-06-29 NOTE — Addendum Note (Signed)
Addended by: Missy Sabins on: 06/29/2022 10:36 AM   Modules accepted: Orders

## 2022-07-01 ENCOUNTER — Telehealth: Payer: Self-pay

## 2022-07-01 NOTE — Telephone Encounter (Signed)
Patient Advocate Encounter  Prior Authorization for Trulance 3MG  has been approved.   Effective: 07/01/2022 to 07/02/2023.  07/04/2023, CPhT Rx Patient Advocate Specialist Phone: [phone]

## 2022-07-01 NOTE — Telephone Encounter (Signed)
Patient Advocate Encounter   Received notification from Karin Golden that prior authorization is required for Trulance 3MG   Submitted: 07/01/2022 Key 07/03/2022 Status is pending  [id number], CPhT Rx Patient Advocate Specialist Phone: [phone]

## 2022-07-01 NOTE — Telephone Encounter (Signed)
Please initiate a PA for Trulance 3 mg daily. Thanks

## 2022-07-09 ENCOUNTER — Encounter: Payer: Self-pay | Admitting: Family

## 2022-07-09 NOTE — Telephone Encounter (Signed)
pt needs office or virtual visit to discuss, thx

## 2022-07-12 ENCOUNTER — Ambulatory Visit: Payer: 59 | Admitting: Family

## 2022-08-30 ENCOUNTER — Encounter: Payer: Self-pay | Admitting: *Deleted

## 2022-09-06 ENCOUNTER — Other Ambulatory Visit: Payer: Self-pay | Admitting: Obstetrics and Gynecology

## 2022-09-06 DIAGNOSIS — R519 Headache, unspecified: Secondary | ICD-10-CM

## 2022-09-09 ENCOUNTER — Ambulatory Visit
Admission: RE | Admit: 2022-09-09 | Discharge: 2022-09-09 | Disposition: A | Discharge status: Home / Self Care | Payer: 59 | Source: Ambulatory Visit | Attending: Obstetrics and Gynecology | Admitting: Obstetrics and Gynecology

## 2022-09-09 DIAGNOSIS — R519 Headache, unspecified: Secondary | ICD-10-CM

## 2022-09-09 MED ORDER — GADOPICLENOL 0.5 MMOL/ML IV SOLN
6.0000 mL | Freq: Once | INTRAVENOUS | Status: AC | PRN
  Administered 2022-09-09: 6 mL via INTRAVENOUS

## 2022-09-16 ENCOUNTER — Other Ambulatory Visit (HOSPITAL_COMMUNITY): Payer: Self-pay

## 2022-09-16 ENCOUNTER — Ambulatory Visit (INDEPENDENT_AMBULATORY_CARE_PROVIDER_SITE_OTHER): Payer: 59 | Admitting: Neurology

## 2022-09-16 ENCOUNTER — Encounter: Payer: Self-pay | Admitting: Neurology

## 2022-09-16 VITALS — BP 118/76 | HR 104 | Ht 62.0 in | Wt 137.0 lb

## 2022-09-16 DIAGNOSIS — G43709 Chronic migraine without aura, not intractable, without status migrainosus: Secondary | ICD-10-CM | POA: Diagnosis not present

## 2022-09-16 MED ORDER — VENLAFAXINE HCL ER 37.5 MG PO CP24
37.5000 mg | ORAL_CAPSULE | Freq: Every day | ORAL | 11 refills | Status: DC
  Filled 2022-09-16 – 2022-10-13 (×2): qty 30, 30d supply, fill #0

## 2022-09-16 MED ORDER — VENLAFAXINE HCL ER 37.5 MG PO CP24: 37.5000 mg | ORAL_CAPSULE | Freq: Every day | ORAL | 11 refills | Status: DC

## 2022-09-16 NOTE — Progress Notes (Signed)
Chief Complaint  Patient presents with   New Patient (Initial Visit)    Rm 15. Alone. NP/Paper/Shanti Shivaji MD [facility]/Headache and fatigue, nonspecific gliosis on MRI.      ASSESSMENT AND PLAN  Angela [name] is a 21 y.o. female   Chronic migraine headaches with visual aura occasionally Secondary amenorrhea Anxiety  MRI incidental finding of right frontal isolated 7 mm abnormal signal, no contrast-enhancement, unsure significant,  We will repeat MRI of the brain with without contrast in 6 to 12 months  Effexor XR 37.5 mg daily as preventive medications,  As needed NSAIDs  Return to clinic with nurse practitioner in 6 months     DIAGNOSTIC DATA (LABS, IMAGING, TESTING) - I reviewed patient records, labs, notes, testing and imaging myself where available.  Laboratory evaluation on 2023: Tecfidera less than 5.0 LH 3.4, FSH 6.1, normal TSH, free T4, prolactin within normal limit hemoglobin 15, MEDICAL HISTORY:  Angela [name] is all 21 year old female, seen in request by Dr. Wilhelmenia Blase, [facility] for evaluation of abnormal MRI of the brain, frequent headaches, initial evaluation was September 16, 2022  I reviewed and summarized the referring note. PMHX.  For the work-up for her secondary amenorrhea, she had MRI of the brain with without contrast September 09, 2022, personally reviewed film, single isolated to 7 mm right frontal  T2/FLAIR hyperdensity lesion with no contrast-enhancement,  Patient had anorexia from 20 19-20 20, with significant weight loss, down to 85 pounds, she recovered very well, but till now, she has never menstruated,  Around 2021, she began to noticed frequent headaches, bilateral frontal retro-orbital area pressure headache, 2-3 times each week, last for couple hours, relieved by sleep, she often did not take any over-the-counter medications, occasionally proceeding with visual auras, she denies focal signs, no visual change,  PHYSICAL EXAM:    Vitals:   09/16/22 0841  BP: 118/76  Pulse: (!) 104  Weight: 137 lb (62.1 kg)  Height: 5\' 2"  (1.575 m)   Not recorded     Body mass index is 25.06 kg/m.  PHYSICAL EXAMNIATION:  Gen: NAD, conversant, well nourised, well groomed                     Cardiovascular: Regular rate rhythm, no peripheral edema, warm, nontender. Eyes: Conjunctivae clear without exudates or hemorrhage Neck: Supple, no carotid bruits. Pulmonary: Clear to auscultation bilaterally   NEUROLOGICAL EXAM:  MENTAL STATUS: Speech/cognition: Awake, alert, oriented to history taking and casual conversation CRANIAL NERVES: CN II: Visual fields are full to confrontation. Pupils are round equal and briskly reactive to light.  Funduscopy examination showed sharp disc edge, I was able to appreciate venous pulsation as well CN III, IV, VI: extraocular movement are normal. No ptosis. CN V: Facial sensation is intact to light touch CN VII: Face is symmetric with normal eye closure  CN VIII: Hearing is normal to causal conversation. CN IX, X: Phonation is normal. CN XI: Head turning and shoulder shrug are intact  MOTOR: There is no pronator drift of out-stretched arms. Muscle bulk and tone are normal. Muscle strength is normal.  REFLEXES: Reflexes are 2+ and symmetric at the biceps, triceps, knees, and ankles. Plantar responses are flexor.  SENSORY: Intact to light touch, pinprick and vibratory sensation are intact in fingers and toes.  COORDINATION: There is no trunk or limb dysmetria noted.  GAIT/STANCE: Posture is normal. Gait is steady with normal steps, base, arm swing, and turning. Heel and toe walking  are normal. Tandem gait is normal.  Romberg is absent.  REVIEW OF SYSTEMS:  Full 14 system review of systems performed and notable only for as above All other review of systems were negative.   ALLERGIES: Allergies  Allergen Reactions   Gluten Meal Nausea And Vomiting    GI upset   Lactose  Intolerance (Gi) Diarrhea    GI Upset    HOME MEDICATIONS: No current outpatient medications on file.   No current facility-administered medications for this visit.    PAST MEDICAL HISTORY: Past Medical History:  Diagnosis Date   Anorexia    Bradycardia     PAST SURGICAL HISTORY: Past Surgical History:  Procedure Laterality Date   NO PAST SURGERIES      FAMILY HISTORY: Family History  Problem Relation Age of Onset   Hyperlipidemia Mother    Hyperlipidemia Maternal Grandmother    Heart disease Paternal Grandfather    Heart attack Paternal Grandfather     SOCIAL HISTORY: Social History   Socioeconomic History   Marital status: Single    Spouse name: Not on file   Number of children: 0   Years of education: Not on file   Highest education level: Not on file  Occupational History   Occupation: CNA  Tobacco Use   Smoking status: Never   Smokeless tobacco: Never  Vaping Use   Vaping Use: Never used  Substance and Sexual Activity   Alcohol use: Never   Drug use: Never   Sexual activity: Never    Birth control/protection: None  Other Topics Concern   Not on file  Social History Narrative   Not on file   Social Determinants of Health   Financial Resource Strain: Unknown (06/07/2019)   Overall Financial Resource Strain (CARDIA)    Difficulty of Paying Living Expenses: Patient refused  Food Insecurity: Unknown (06/07/2019)   Hunger Vital Sign    Worried About Running Out of Food in the Last Year: Patient refused    Ran Out of Food in the Last Year: Patient refused  Transportation Needs: Unknown (06/07/2019)   PRAPARE - Transportation    Lack of Transportation (Medical): Patient refused    Lack of Transportation (Non-Medical): Patient refused  Physical Activity: Unknown (06/07/2019)   Exercise Vital Sign    Days of Exercise per Week: Patient refused    Minutes of Exercise per Session: Patient refused  Stress: Not on file  Social Connections: Unknown (06/07/2019)    Social Connection and Isolation Panel [NHANES]    Frequency of Communication with Friends and Family: Patient refused    Frequency of Social Gatherings with Friends and Family: Patient refused    Attends Religious Services: Patient refused    Active Member of Clubs or Organizations: Patient refused    Attends Banker Meetings: Patient refused    Marital Status: Patient refused  Intimate Partner Violence: Unknown (06/07/2019)   Humiliation, Afraid, Rape, and Kick questionnaire    Fear of Current or Ex-Partner: Patient refused    Emotionally Abused: Patient refused    Physically Abused: Patient refused    Sexually Abused: Patient refused      Levert Feinstein, M.D. Ph.D.  [facility] [address] [place], [place] [address] Ph: [phone] Fax: [phone]  CC:  Carlisle Cater, MD [address] [place],  [place] [address]  Dulce Sellar, NP

## 2022-09-19 ENCOUNTER — Encounter: Payer: Self-pay | Admitting: Neurology

## 2022-10-13 ENCOUNTER — Other Ambulatory Visit (HOSPITAL_COMMUNITY): Payer: Self-pay

## 2022-10-22 ENCOUNTER — Other Ambulatory Visit (HOSPITAL_COMMUNITY): Payer: Self-pay

## 2022-11-18 ENCOUNTER — Encounter: Payer: Self-pay | Admitting: *Deleted

## 2022-11-18 ENCOUNTER — Encounter: Payer: Self-pay | Admitting: Adult Health

## 2022-12-03 DIAGNOSIS — R9089 Other abnormal findings on diagnostic imaging of central nervous system: Secondary | ICD-10-CM | POA: Insufficient documentation

## 2023-01-06 LAB — HM PAP SMEAR

## 2023-01-06 LAB — RESULTS CONSOLE HPV: CHL HPV: NEGATIVE

## 2023-02-01 ENCOUNTER — Emergency Department (HOSPITAL_BASED_OUTPATIENT_CLINIC_OR_DEPARTMENT_OTHER)
Admission: EM | Admit: 2023-02-01 | Discharge: 2023-02-01 | Disposition: A | Discharge status: Home / Self Care | Payer: 59 | Attending: Emergency Medicine | Admitting: Emergency Medicine

## 2023-02-01 ENCOUNTER — Other Ambulatory Visit: Payer: Self-pay

## 2023-02-01 ENCOUNTER — Encounter (HOSPITAL_BASED_OUTPATIENT_CLINIC_OR_DEPARTMENT_OTHER): Payer: Self-pay | Admitting: Emergency Medicine

## 2023-02-01 ENCOUNTER — Telehealth: Payer: Self-pay

## 2023-02-01 ENCOUNTER — Emergency Department (HOSPITAL_BASED_OUTPATIENT_CLINIC_OR_DEPARTMENT_OTHER): Payer: 59

## 2023-02-01 DIAGNOSIS — R1030 Lower abdominal pain, unspecified: Secondary | ICD-10-CM

## 2023-02-01 DIAGNOSIS — R1084 Generalized abdominal pain: Secondary | ICD-10-CM | POA: Diagnosis not present

## 2023-02-01 DIAGNOSIS — R1031 Right lower quadrant pain: Secondary | ICD-10-CM | POA: Diagnosis not present

## 2023-02-01 LAB — COMPREHENSIVE METABOLIC PANEL
ALT: 15 U/L (ref 0–44)
AST: 18 U/L (ref 15–41)
Albumin: 4.8 g/dL (ref 3.5–5.0)
Alkaline Phosphatase: 66 U/L (ref 38–126)
Anion gap: 7 (ref 5–15)
BUN: 21 mg/dL — ABNORMAL HIGH (ref 6–20)
CO2: 30 mmol/L (ref 22–32)
Calcium: 10.1 mg/dL (ref 8.9–10.3)
Chloride: 100 mmol/L (ref 98–111)
Creatinine, Ser: 0.68 mg/dL (ref 0.44–1.00)
GFR, Estimated: >60 mL/min (ref >60)
Glucose, Bld: 105 mg/dL — ABNORMAL HIGH (ref 70–99)
Potassium: 4.1 mmol/L (ref 3.5–5.1)
Sodium: 137 mmol/L (ref 135–145)
Total Bilirubin: 0.5 mg/dL (ref 0.3–1.2)
Total Protein: 7.5 g/dL (ref 6.5–8.1)

## 2023-02-01 LAB — CBC
HCT: 42.5 % (ref 36.0–46.0)
Hemoglobin: 14.6 g/dL (ref 12.0–15.0)
MCH: 33.2 pg (ref 26.0–34.0)
MCHC: 34.4 g/dL (ref 30.0–36.0)
MCV: 96.6 fL (ref 80.0–100.0)
Platelets: 272 10*3/uL (ref 150–400)
RBC: 4.4 MIL/uL (ref 3.87–5.11)
RDW: 12.5 % (ref 11.5–15.5)
WBC: 7.6 10*3/uL (ref 4.0–10.5)
nRBC: 0 % (ref 0.0–0.2)

## 2023-02-01 LAB — URINALYSIS, ROUTINE W REFLEX MICROSCOPIC
Bilirubin Urine: NEGATIVE
Glucose, UA: NEGATIVE mg/dL
Hgb urine dipstick: NEGATIVE
Ketones, ur: NEGATIVE mg/dL
Leukocytes,Ua: NEGATIVE
Nitrite: NEGATIVE
Protein, ur: NEGATIVE mg/dL
Specific Gravity, Urine: 1.026 (ref 1.005–1.030)
pH: 7 (ref 5.0–8.0)

## 2023-02-01 LAB — PREGNANCY, URINE: Preg Test, Ur: NEGATIVE

## 2023-02-01 LAB — LIPASE, BLOOD: Lipase: 19 U/L (ref 11–51)

## 2023-02-01 MED ORDER — ONDANSETRON HCL 4 MG/2ML IJ SOLN
4.0000 mg | Freq: Once | INTRAMUSCULAR | Status: AC
  Administered 2023-02-01: 4 mg via INTRAVENOUS
  Filled 2023-02-01: qty 2

## 2023-02-01 MED ORDER — FENTANYL CITRATE PF 50 MCG/ML IJ SOSY
50.0000 ug | PREFILLED_SYRINGE | Freq: Once | INTRAMUSCULAR | Status: AC
  Administered 2023-02-01: 50 ug via INTRAVENOUS
  Filled 2023-02-01: qty 1

## 2023-02-01 MED ORDER — DICYCLOMINE HCL 20 MG PO TABS: 20.0000 mg | ORAL_TABLET | Freq: Two times a day (BID) | ORAL | 0 refills | Status: DC

## 2023-02-01 MED ORDER — ONDANSETRON 4 MG PO TBDP: 4.0000 mg | ORAL_TABLET | Freq: Three times a day (TID) | ORAL | 0 refills | Status: DC | PRN

## 2023-02-01 MED ORDER — IOHEXOL 300 MG/ML  SOLN
100.0000 mL | Freq: Once | INTRAMUSCULAR | Status: AC | PRN
  Administered 2023-02-01: 80 mL via INTRAVENOUS

## 2023-02-01 MED ORDER — SODIUM CHLORIDE 0.9 % IV BOLUS
1000.0000 mL | Freq: Once | INTRAVENOUS | Status: AC
  Administered 2023-02-01: 1000 mL via INTRAVENOUS

## 2023-02-01 NOTE — ED Provider Notes (Signed)
[place]  Provider Note  CSN: [id number] Arrival date & time: 02/01/23 0245  History Chief Complaint  Patient presents with   Abdominal Pain    Angela [name] is a 22 y.o. female with no significant PMH reports 2-3 days of intermittent sharp pains in lower abdomen, shoots from right to left, associated with nausea but no vomiting. No diarrhea, dysuria, or vaginal complaints. Does not think she is pregnant. No concern for STI. Pain worsened during the night tonight. No fevers. Occasional history of similar pains but short lived and never sought care before.    Home Medications Prior to Admission medications   Medication Sig Start Date End Date Taking? Authorizing Provider  dicyclomine (BENTYL) 20 MG tablet Take 1 tablet (20 mg total) by mouth 2 (two) times daily. 02/01/23  Yes Truddie Hidden, MD  ondansetron (ZOFRAN-ODT) 4 MG disintegrating tablet Take 1 tablet (4 mg total) by mouth every 8 (eight) hours as needed for nausea or vomiting. 02/01/23  Yes Truddie Hidden, MD  venlafaxine XR (EFFEXOR XR) 37.5 MG 24 hr capsule Take 1 capsule (37.5 mg total) by mouth daily with breakfast. 09/16/22   Marcial Pacas, MD     Allergies    Gluten meal and Lactose intolerance (gi)   Review of Systems   Review of Systems Please see HPI for pertinent positives and negatives  Physical Exam BP 128/82   Pulse (!) 52   Temp 97.9 F (36.6 C) (Oral)   Resp 18   Wt 65.8 kg   LMP 12/20/2022   SpO2 99%   BMI 26.52 kg/m   Physical Exam Vitals and nursing note reviewed.  Constitutional:      Appearance: Normal appearance.  HENT:     Head: Normocephalic and atraumatic.     Nose: Nose normal.     Mouth/Throat:     Mouth: Mucous membranes are moist.  Eyes:     Extraocular Movements: Extraocular movements intact.     Conjunctiva/sclera: Conjunctivae normal.  Cardiovascular:     Rate and Rhythm: Normal rate.  Pulmonary:     Effort:  Pulmonary effort is normal.     Breath sounds: Normal breath sounds.  Abdominal:     General: Abdomen is flat.     Palpations: Abdomen is soft.     Tenderness: There is generalized abdominal tenderness and tenderness in the right lower quadrant. There is no guarding. Negative signs include Murphy's sign and McBurney's sign.  Musculoskeletal:        General: No swelling. Normal range of motion.     Cervical back: Neck supple.  Skin:    General: Skin is warm and dry.  Neurological:     General: No focal deficit present.     Mental Status: She is alert.  Psychiatric:        Mood and Affect: Mood normal.     ED Results / Procedures / Treatments   EKG None  Procedures Procedures  Medications Ordered in the ED Medications  sodium chloride 0.9 % bolus 1,000 mL (1,000 mLs Intravenous New Bag/Given 02/01/23 0346)  ondansetron (ZOFRAN) injection 4 mg (4 mg Intravenous Given 02/01/23 0341)  fentaNYL (SUBLIMAZE) injection 50 mcg (50 mcg Intravenous Given 02/01/23 0341)  iohexol (OMNIPAQUE) 300 MG/ML solution 100 mL (80 mLs Intravenous Contrast Given 02/01/23 0402)    Initial Impression and Plan  Patient here with nonspecific abdominal pain. Reports lower abdomen as the area of concern but on exam  more diffusely tender although some focal tenderness without peritoneal signs in RLQ. Will check labs, pain/nausea meds for comfort.   ED Course   Clinical Course as of 02/01/23 0457  Tue Feb 01, 2023  0333 CBC is normal. UA and HCG negative.  [CS]  [id number] CMP and lipase are normal. No indication of biliary disease. Will send for CT to rule out other acute processes such as appendicitis, ovarian cyst, etc.  [CS]  0455 I personally viewed the images from radiology studies and agree with radiologist interpretation: Patient with no acute process. Patient reports pain is improved. Plan rx for Zofran/bentyl for symptom controlled. PCP follow up, RTED for any other concerns including worsening pain,  intractable vomiting, fever, blood in stool.  [CS]    Clinical Course User Index [CS] Truddie Hidden, MD     MDM Rules/Calculators/A&P Medical Decision Making Problems Addressed: Lower abdominal pain: acute illness or injury  Amount and/or Complexity of Data Reviewed Labs: ordered. Decision-making details documented in ED Course. Radiology: ordered and independent interpretation performed. Decision-making details documented in ED Course.  Risk Prescription drug management. Parenteral controlled substances.     Final Clinical Impression(s) / ED Diagnoses Final diagnoses:  Lower abdominal pain    Rx / DC Orders ED Discharge Orders          Ordered    ondansetron (ZOFRAN-ODT) 4 MG disintegrating tablet  Every 8 hours PRN        02/01/23 0456    dicyclomine (BENTYL) 20 MG tablet  2 times daily        02/01/23 0456             Truddie Hidden, MD 02/01/23 [phone]

## 2023-02-01 NOTE — ED Triage Notes (Signed)
Pt in with low central abdominal pain, "shoots from right to left sides". Pt states it first started on Saturday night, and has worsened throughout the night. +nausea. LMP 1/15

## 2023-02-01 NOTE — Telephone Encounter (Signed)
Unable to LMOVM, mail box full. Calling to go over recent ER visit.

## 2023-02-01 NOTE — ED Notes (Signed)
RN reviewed discharge instructions with pt. Pt verbalized understanding and had no further questions. VSS upon discharge.  

## 2023-02-01 NOTE — ED Notes (Signed)
ED Provider at bedside. 

## 2023-02-01 NOTE — ED Notes (Signed)
Patient transported to CT 

## 2023-03-14 ENCOUNTER — Ambulatory Visit: Payer: 59 | Admitting: Adult Health

## 2023-03-21 ENCOUNTER — Encounter: Payer: Self-pay | Admitting: *Deleted

## 2023-05-09 ENCOUNTER — Other Ambulatory Visit (HOSPITAL_BASED_OUTPATIENT_CLINIC_OR_DEPARTMENT_OTHER): Payer: Self-pay

## 2023-05-09 MED ORDER — B2 100 MG PO TABS
2.0000 | ORAL_TABLET | Freq: Every day | ORAL | 3 refills | Status: DC
  Filled 2023-05-09: qty 100, 50d supply, fill #0

## 2023-05-09 MED ORDER — AMITRIPTYLINE HCL 10 MG PO TABS
10.0000 mg | ORAL_TABLET | Freq: Every day | ORAL | 2 refills | Status: DC
  Filled 2023-05-09: qty 30, 30d supply, fill #0

## 2023-05-11 ENCOUNTER — Other Ambulatory Visit (INDEPENDENT_AMBULATORY_CARE_PROVIDER_SITE_OTHER): Payer: 59

## 2023-05-11 ENCOUNTER — Ambulatory Visit (INDEPENDENT_AMBULATORY_CARE_PROVIDER_SITE_OTHER): Payer: 59 | Admitting: Orthopaedic Surgery

## 2023-05-11 DIAGNOSIS — M79672 Pain in left foot: Secondary | ICD-10-CM

## 2023-05-11 NOTE — Progress Notes (Signed)
Office Visit Note   Patient: Angela [name]           Date of Birth: 2000-12-17           MRN: [id number] Visit Date: 05/11/2023              Requested by: Dulce Sellar, NP [address] [place],  [place] [address] PCP: Dulce Sellar, NP   Assessment & Plan: Visit Diagnoses:  1. Pain in left foot     Plan: Impression is 22 year old female with left forefoot pain.  Etiology likely capsulitis to the MTP joints.  Recommend carbon footplate insert, Voltaren gel, oral NSAIDs up to twice a day for couple weeks.  Activity modification as needed.  Follow-Up Instructions: No follow-ups on file.   Orders:  Orders Placed This Encounter  Procedures   XR Foot Complete Left   No orders of the defined types were placed in this encounter.     Procedures: No procedures performed   Clinical Data: No additional findings.   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  Endorses subjective swelling.  Denies any injuries.  States that it started hurting during a walk.  Denies any injuries.  She has found that over-the-counter NSAIDs help a little bit. Review of Systems  Constitutional: Negative.   HENT: Negative.    Eyes: Negative.   Respiratory: Negative.    Cardiovascular: Negative.   Endocrine: Negative.   Musculoskeletal: Negative.   Neurological: Negative.   Hematological: Negative.   Psychiatric/Behavioral: Negative.    All other systems reviewed and are negative.    Objective: Vital Signs: There were no vitals taken for this visit.  Physical Exam Vitals and nursing note reviewed.  Constitutional:      Appearance: She is well-developed.  HENT:     Head: Atraumatic.     Nose: Nose normal.  Eyes:     Extraocular Movements: Extraocular movements intact.   Cardiovascular:     Pulses: Normal pulses.  Pulmonary:     Effort: Pulmonary effort is normal.  Abdominal:     Palpations: Abdomen is soft.  Musculoskeletal:     Cervical back: Neck supple.  Skin:    General: Skin is warm.     Capillary Refill: Capillary refill takes less than 2 seconds.  Neurological:     Mental Status: She is alert. Mental status is at baseline.  Psychiatric:        Behavior: Behavior normal.        Thought Content: Thought content normal.        Judgment: Judgment normal.     Ortho Exam Examination of the left foot shows no focal abnormalities.  She has full range of motion of the MTP joints.  No neurovascular compromise. Specialty Comments:  No specialty comments available.  Imaging: No results found.   PMFS History: Patient Active Problem List   Diagnosis Date Noted   Chronic migraine w/o aura w/o status migrainosus, not intractable 09/16/2022   Pain and swelling of left knee 08/20/2019   Intractable left heel pain 07/30/2019   Wart right thigh 07/30/2019   Polyuria    Bradycardia 06/07/2019   Macrocytosis    Hypophosphatemia 08/09/2018   Eating disorder 08/08/2018   Moderate protein-calorie malnutrition (HCC) 08/08/2018  Chronic bilateral low back pain without sciatica 02/05/2018   Past Medical History:  Diagnosis Date   Anorexia    Bradycardia     Family History  Problem Relation Age of Onset   Hyperlipidemia Mother    Hyperlipidemia Maternal Grandmother    Heart disease Paternal Grandfather    Heart attack Paternal Grandfather     Past Surgical History:  Procedure Laterality Date   NO PAST SURGERIES     Social History   Occupational History   Occupation: CNA  Tobacco Use   Smoking status: Never   Smokeless tobacco: Never  Vaping Use   Vaping Use: Never used  Substance and Sexual Activity   Alcohol use: Never   Drug use: Never   Sexual activity: Never    Birth control/protection: None

## 2023-05-12 ENCOUNTER — Other Ambulatory Visit (HOSPITAL_BASED_OUTPATIENT_CLINIC_OR_DEPARTMENT_OTHER): Payer: Self-pay

## 2023-05-12 MED ORDER — TINIDAZOLE 500 MG PO TABS
500.0000 mg | ORAL_TABLET | Freq: Two times a day (BID) | ORAL | 0 refills | Status: DC
  Filled 2023-05-12: qty 28, 14d supply, fill #0

## 2023-05-12 MED ORDER — PROBENECID 500 MG PO TABS
500.0000 mg | ORAL_TABLET | Freq: Every day | ORAL | 0 refills | Status: DC
  Filled 2023-05-12: qty 21, 21d supply, fill #0

## 2023-05-12 MED ORDER — LORATADINE 10 MG PO TABS
10.0000 mg | ORAL_TABLET | Freq: Every day | ORAL | 0 refills | Status: DC
  Filled 2023-05-12: qty 100, 100d supply, fill #0

## 2023-05-12 MED ORDER — AMOXICILLIN 500 MG PO CAPS
1500.0000 mg | ORAL_CAPSULE | Freq: Two times a day (BID) | ORAL | 0 refills | Status: DC
  Filled 2023-05-12: qty 126, 21d supply, fill #0

## 2023-05-12 MED ORDER — FLUCONAZOLE 100 MG PO TABS
100.0000 mg | ORAL_TABLET | Freq: Two times a day (BID) | ORAL | 0 refills | Status: DC
  Filled 2023-05-12: qty 56, 28d supply, fill #0

## 2023-05-19 ENCOUNTER — Other Ambulatory Visit (HOSPITAL_BASED_OUTPATIENT_CLINIC_OR_DEPARTMENT_OTHER): Payer: Self-pay

## 2023-05-19 MED ORDER — ONDANSETRON HCL 4 MG PO TABS
ORAL_TABLET | ORAL | 0 refills | Status: DC
  Filled 2023-05-19: qty 18, 3d supply, fill #0

## 2023-06-08 ENCOUNTER — Other Ambulatory Visit (HOSPITAL_BASED_OUTPATIENT_CLINIC_OR_DEPARTMENT_OTHER): Payer: Self-pay

## 2023-06-08 MED ORDER — VITAMIN B-2 100 MG PO TABS: 200.0000 mg | ORAL_TABLET | Freq: Every day | ORAL | 3 refills | Status: DC

## 2023-06-08 MED ORDER — AMITRIPTYLINE HCL 10 MG PO TABS
10.0000 mg | ORAL_TABLET | Freq: Every day | ORAL | 3 refills | Status: DC
  Filled 2023-06-08 – 2023-06-25 (×2): qty 30, 30d supply, fill #0

## 2023-06-15 ENCOUNTER — Other Ambulatory Visit: Payer: Self-pay | Admitting: Oncology

## 2023-06-15 DIAGNOSIS — Z006 Encounter for examination for normal comparison and control in clinical research program: Secondary | ICD-10-CM

## 2023-06-21 ENCOUNTER — Other Ambulatory Visit (HOSPITAL_BASED_OUTPATIENT_CLINIC_OR_DEPARTMENT_OTHER): Payer: Self-pay

## 2023-06-21 ENCOUNTER — Other Ambulatory Visit (HOSPITAL_COMMUNITY)
Admission: RE | Admit: 2023-06-21 | Discharge: 2023-06-21 | Disposition: A | Discharge status: Home / Self Care | Payer: 59 | Source: Ambulatory Visit | Attending: Oncology | Admitting: Oncology

## 2023-06-21 DIAGNOSIS — Z006 Encounter for examination for normal comparison and control in clinical research program: Secondary | ICD-10-CM | POA: Insufficient documentation

## 2023-06-25 ENCOUNTER — Other Ambulatory Visit (HOSPITAL_BASED_OUTPATIENT_CLINIC_OR_DEPARTMENT_OTHER): Payer: Self-pay

## 2023-07-12 ENCOUNTER — Encounter: Payer: Self-pay | Admitting: Sports Medicine

## 2023-07-12 ENCOUNTER — Ambulatory Visit (INDEPENDENT_AMBULATORY_CARE_PROVIDER_SITE_OTHER): Payer: 59 | Admitting: Sports Medicine

## 2023-07-12 VITALS — BP 137/77 | HR 101 | Ht 62.0 in | Wt 145.0 lb

## 2023-07-12 DIAGNOSIS — M222X2 Patellofemoral disorders, left knee: Secondary | ICD-10-CM

## 2023-07-12 DIAGNOSIS — G8929 Other chronic pain: Secondary | ICD-10-CM

## 2023-07-12 DIAGNOSIS — G939 Disorder of brain, unspecified: Secondary | ICD-10-CM | POA: Diagnosis not present

## 2023-07-12 DIAGNOSIS — Z0000 Encounter for general adult medical examination without abnormal findings: Secondary | ICD-10-CM | POA: Diagnosis not present

## 2023-07-12 DIAGNOSIS — M25552 Pain in left hip: Secondary | ICD-10-CM | POA: Diagnosis not present

## 2023-07-12 DIAGNOSIS — M222X1 Patellofemoral disorders, right knee: Secondary | ICD-10-CM

## 2023-07-12 DIAGNOSIS — G43109 Migraine with aura, not intractable, without status migrainosus: Secondary | ICD-10-CM | POA: Insufficient documentation

## 2023-07-12 NOTE — Assessment & Plan Note (Signed)
Chronic pain of the left hip, she localizes it in the groin, she has a positive FADIR sign, historically x-rays have been negative, she does have weak hip abductor's. I suspect she may have a labral injury, she does tell me today that it is not bad enough for [place] to really need to investigate, however if it worsens we will proceed with MR arthrography.

## 2023-07-12 NOTE — Assessment & Plan Note (Addendum)
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 Her neurologic exam is normal today. The plan is repeat MRI later this year. We will follow from afar.  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  Stability suggests against multiple sclerosis, this is likely just an incidental finding, it is recommended to do a single additional MRI in 1 year, and then no more will be needed.

## 2023-07-12 NOTE — Assessment & Plan Note (Signed)
Angela [name] does endorse multiple headaches, sometimes every day, she also tells me they are unilateral, throbbing, she does get photophobia, phonophobia, nausea. She did have a brain MRI and is established with a neurologist, the brain MRI did show what appeared to be a demyelinating spot right sided parietal lobe based on the homunculus she does not have any focal neurologic symptoms that would correspond to the location of her lesion, i.e. does not have any left-sided focal weakness or paresthesias. She has been on amitriptyline, gabapentin, topiramate all without sufficient improvement and with excessive adverse effects, she has stopped all of these medications. She is not interested in any preventative or abortive treatment, I did let her know that Nurtec would be an option should her headaches become intolerable.

## 2023-07-12 NOTE — Progress Notes (Addendum)
Subjective:    CC: Annual Physical Exam  HPI:  This patient is here for their annual physical  I reviewed the past medical history, family history, social history, surgical history, and allergies today and no changes were needed.  Please see the problem list section below in epic for further details.  Past Medical History: Past Medical History:  Diagnosis Date   Anorexia    Bradycardia    Past Surgical History: Past Surgical History:  Procedure Laterality Date   NO PAST SURGERIES     Social History: Social History   Socioeconomic History   Marital status: Single    Spouse name: Not on file   Number of children: 0   Years of education: Not on file   Highest education level: Not on file  Occupational History   Occupation: CNA  Tobacco Use   Smoking status: Never   Smokeless tobacco: Never  Vaping Use   Vaping status: Never Used  Substance and Sexual Activity   Alcohol use: Never   Drug use: Never   Sexual activity: Never    Birth control/protection: None  Other Topics Concern   Not on file  Social History Narrative   Not on file   Social Determinants of Health   Financial Resource Strain: Low Risk  (06/01/2023)   Received from [facility]   Overall Financial Resource Strain (CARDIA)    Difficulty of Paying Living Expenses: Not hard at all  Food Insecurity: No Food Insecurity (06/01/2023)   [facility]   Hunger Vital Sign    Worried About Running Out of Food in the Last Year: Never true    Ran Out of Food in the Last Year: Never true  Transportation Needs: No Transportation Needs (06/01/2023)   Received from [facility] - Transportation    Lack of Transportation (Medical): No    Lack of Transportation (Non-Medical): No  Physical Activity: Sufficiently Active (01/23/2023)   Received from [facility]   Exercise Vital Sign    Days of Exercise per Week: 4 days    Minutes of Exercise per Session: 40 min  Stress:  No Stress Concern Present (01/23/2023)   Received from [facility] - Occupational Stress Questionnaire    Feeling of Stress : Only a little  Social Connections: Socially Integrated (01/23/2023)   Received from [facility]   Social Network    How would you rate your social network (family, work, friends)?: Good participation with social networks   Family History: Family History  Problem Relation Age of Onset   Hyperlipidemia Mother    Hyperlipidemia Maternal Grandmother    Heart disease Paternal Grandfather    Heart attack Paternal Grandfather    Allergies: Allergies  Allergen Reactions   Gluten Meal Nausea And Vomiting    GI upset   Lactose Intolerance (Gi) Diarrhea    GI Upset   Medications: See med rec.  Review of Systems: No headache, visual changes, nausea, vomiting, diarrhea, constipation, dizziness, abdominal pain, skin rash, fevers, chills, night sweats, swollen lymph nodes, weight loss, chest pain, body aches, joint swelling, muscle aches, shortness of breath, mood changes, visual or auditory hallucinations.  Objective:    General: Well Developed, well nourished, and in no acute distress.  Neuro: Alert and oriented x3, extra-ocular muscles intact, sensation grossly intact. Cranial nerves II through XII are intact, motor, sensory, and coordinative functions are all intact. HEENT: Normocephalic,  atraumatic, pupils equal round reactive to light, neck supple, no masses, no lymphadenopathy, thyroid nonpalpable. Oropharynx, nasopharynx, external ear canals are unremarkable. Skin: Warm and dry, no rashes noted.  Cardiac: Regular rate and rhythm, no murmurs rubs or gallops.  Respiratory: Clear to auscultation bilaterally. Not using accessory muscles, speaking in full sentences.  Abdominal: Soft, nontender, nondistended, positive bowel sounds, no masses, no organomegaly.  Musculoskeletal: Shoulder, elbow,  wrist, hip, knee, ankle stable, and with full range of motion.  Impression and Recommendations:    The patient was counselled, risk factors were discussed, anticipatory guidance given.  Annual physical exam Angela [name] is here to establish care, she is an exquisitely pleasant 22 year old female, we discussed some of her history, she is overall doing well, she is happy, she is up-to-date on her screening measures, she is happy with somewhat of a minimalistic approach. I did her annual physical today. We can see her back in a year fasting.  Chronic left hip pain Chronic pain of the left hip, she localizes it in the groin, she has a positive FADIR sign, historically x-rays have been negative, she does have weak hip abductor's. I suspect she may have a labral injury, she does tell me today that it is not bad enough for [place] to really need to investigate, however if it worsens we will proceed with MR arthrography.  Complicated migraine Angela [name] does endorse multiple headaches, sometimes every day, she also tells me they are unilateral, throbbing, she does get photophobia, phonophobia, nausea. She did have a brain MRI and is established with a neurologist, the brain MRI did show what appeared to be a demyelinating spot right sided parietal lobe based on the homunculus she does not have any focal neurologic symptoms that would correspond to the location of her lesion, i.e. does not have any left-sided focal weakness or paresthesias. She has been on amitriptyline, gabapentin, topiramate all without sufficient improvement and with excessive adverse effects, she has stopped all of these medications. She is not interested in any preventative or abortive treatment, I did let her know that Nurtec would be an option should her headaches become intolerable.   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 Her neurologic exam is normal today. The plan is repeat MRI later this year. We will follow from afar.  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  Stability suggests against multiple sclerosis, this is likely just an incidental finding, it is recommended to do a single additional MRI in 1 year, and then no more will be needed.  Patellofemoral syndrome of both knees Currently does have bilateral knee pain worse when running, going up and down stairs, localized anteriorly and medial to the patella. Knee exam is normal, she has good vastus medialis definition, she does however have significantly and remarkably weak hip abductor strength bilaterally left worse than right. I explained to her the biomechanics of patellofemoral syndrome, we will start a hip abductor conditioning program and she can do this for 6 weeks and then let me know if things are not improved.   ____________________________________________ Ihor Austin. Benjamin Stain, M.D., ABFM., CAQSM., AME. Primary Care and Sports Medicine [facility]  Adjunct Professor of Family Medicine  [employer]  Restaurant manager, fast food

## 2023-07-12 NOTE — Patient Instructions (Signed)
Hip Rehabilitation Protocol:  1.  Side leg raises.  3x30 with no weight, then 3x15 with 2 lb ankle weight, then 3x15 with 5 lb ankle weight 2.  Standing hip rotation.  3x30 with no weight, then 3x15 with 2 lb ankle weight, then 3x15 with 5 lb ankle weight. 3.  Side step ups.  3x30 with no weight, then 3x15 with 5 lbs in backpack, then 3x15 with 10 lbs in backpack. 

## 2023-07-12 NOTE — Assessment & Plan Note (Signed)
Currently does have bilateral knee pain worse when running, going up and down stairs, localized anteriorly and medial to the patella. Knee exam is normal, she has good vastus medialis definition, she does however have significantly and remarkably weak hip abductor strength bilaterally left worse than right. I explained to her the biomechanics of patellofemoral syndrome, we will start a hip abductor conditioning program and she can do this for 6 weeks and then let me know if things are not improved.

## 2023-07-12 NOTE — Assessment & Plan Note (Signed)
Angela [name] is here to establish care, she is an exquisitely pleasant 22 year old female, we discussed some of her history, she is overall doing well, she is happy, she is up-to-date on her screening measures, she is happy with somewhat of a minimalistic approach. I did her annual physical today. We can see her back in a year fasting.

## 2023-07-14 ENCOUNTER — Encounter: Payer: Self-pay | Admitting: Sports Medicine

## 2023-07-14 DIAGNOSIS — G43109 Migraine with aura, not intractable, without status migrainosus: Secondary | ICD-10-CM

## 2023-07-15 MED ORDER — NURTEC 75 MG PO TBDP: 1.0000 | ORAL_TABLET | ORAL | 11 refills | Status: AC

## 2023-07-22 ENCOUNTER — Ambulatory Visit: Payer: 59 | Admitting: Sports Medicine

## 2023-07-25 NOTE — Addendum Note (Signed)
Addended by: Monica Becton on: 07/25/2023 01:31 PM   Modules accepted: Orders

## 2023-08-09 ENCOUNTER — Ambulatory Visit (INDEPENDENT_AMBULATORY_CARE_PROVIDER_SITE_OTHER): Payer: 59

## 2023-08-09 DIAGNOSIS — G43109 Migraine with aura, not intractable, without status migrainosus: Secondary | ICD-10-CM

## 2023-08-09 MED ORDER — GADOBUTROL 1 MMOL/ML IV SOLN
7.0000 mL | Freq: Once | INTRAVENOUS | Status: AC | PRN
  Administered 2023-08-09: 7 mL via INTRAVENOUS

## 2023-08-12 ENCOUNTER — Telehealth: Payer: Self-pay

## 2023-08-12 NOTE — Telephone Encounter (Addendum)
Initiated Prior authorization GNF:AOZHYQ 75MG  dispersible tablets Via: Covermymeds Case/Key:[id number] Status: denied as of 08/12/23 Reason:The request for coverage for NURTEC TAB 75MG  ODT, use as directed (15 per month), is denied. This decision is based on health plan criteria for NURTEC TAB 75MG  ODT. This medicine is covered only if: You have a history of failure (after a trial of at least two months), contraindication or intolerance to one of the following preventative therapies (document name and date tried): (I) One of the following beta-blockers: Atenolol, metoprolol, nadolol, propranolol, or timolol. (II) Candesartan (Atacand). (III) Divalproex sodium (Depakote/Depakote ER). (IV) Topiramate (Topamax). (V) Venlafaxine (Effexor/Effexor XR). The information provided does not show that you meet the criteria listed above. *Please note: This medication cannot be further reviewed for the quantity limit until the above criteria have been addressed. The reason(s) [employer] did not approve this medication can be found above. This denial is based on our Nurtec drug coverage policy, in addition to any supplementary information you or your prescriber may have submitted Notified Pt via: Mychart

## 2023-08-12 NOTE — Telephone Encounter (Signed)
Angela [name], it says in my note on 07/12/2023 under the diagnosis of complicated migraine that she failed topiramate already.  Please resubmit and let the patient know the reason for resubmission.

## 2023-08-29 ENCOUNTER — Other Ambulatory Visit: Payer: Self-pay | Admitting: Endocrinology

## 2023-08-29 DIAGNOSIS — N912 Amenorrhea, unspecified: Secondary | ICD-10-CM

## 2023-09-06 ENCOUNTER — Ambulatory Visit: Payer: 59

## 2023-09-06 DIAGNOSIS — R200 Anesthesia of skin: Secondary | ICD-10-CM | POA: Diagnosis not present

## 2023-09-06 DIAGNOSIS — N912 Amenorrhea, unspecified: Secondary | ICD-10-CM

## 2023-09-06 DIAGNOSIS — R519 Headache, unspecified: Secondary | ICD-10-CM | POA: Diagnosis not present

## 2023-09-06 MED ORDER — GADOBUTROL 1 MMOL/ML IV SOLN
6.5000 mL | Freq: Once | INTRAVENOUS | Status: AC | PRN
  Administered 2023-09-06: 6.5 mL via INTRAVENOUS

## 2023-09-20 DIAGNOSIS — G43719 Chronic migraine without aura, intractable, without status migrainosus: Secondary | ICD-10-CM | POA: Insufficient documentation

## 2023-09-22 ENCOUNTER — Ambulatory Visit (INDEPENDENT_AMBULATORY_CARE_PROVIDER_SITE_OTHER): Payer: 59 | Admitting: Sports Medicine

## 2023-09-22 ENCOUNTER — Encounter: Payer: Self-pay | Admitting: Sports Medicine

## 2023-09-22 VITALS — BP 136/81 | HR 77

## 2023-09-22 DIAGNOSIS — M222X2 Patellofemoral disorders, left knee: Secondary | ICD-10-CM

## 2023-09-22 DIAGNOSIS — G43109 Migraine with aura, not intractable, without status migrainosus: Secondary | ICD-10-CM

## 2023-09-22 DIAGNOSIS — M25552 Pain in left hip: Secondary | ICD-10-CM | POA: Diagnosis not present

## 2023-09-22 DIAGNOSIS — M222X1 Patellofemoral disorders, right knee: Secondary | ICD-10-CM

## 2023-09-22 DIAGNOSIS — R5383 Other fatigue: Secondary | ICD-10-CM | POA: Diagnosis not present

## 2023-09-22 DIAGNOSIS — G8929 Other chronic pain: Secondary | ICD-10-CM

## 2023-09-22 NOTE — Assessment & Plan Note (Signed)
Doing much better, we did recommend VMO and hip abductor conditioning, she has improve dramatically but still has a ways to go until her left-sided hip abductors are congruent in strength with the right.

## 2023-09-22 NOTE — Assessment & Plan Note (Signed)
Excessive daytime fatigue. She does a history of Lyme disease, potentially chronic Lyme disease She paints a picture of fairly good sleep hygiene, she eats dinner at 5, in bed by 9, immediately sleep, no bright screens or caffeine past 1:00 PM. She sleeps through the night with no awakenings and awakes in the morning not feeling well rested. We did discuss a workup including routine labs as well as a sleep study, she will think about it. Ultimately if all of the above is performed and negative we can consider chronic fatigue syndrome and potentially using a stimulant.

## 2023-09-22 NOTE — Assessment & Plan Note (Signed)
As above doing much better with hip abductor conditioning, historically she did have a positive FADIR sign with negative x-rays, if symptoms persist and spite of aggressive hip abductor conditioning and strengthening for her left hip compared to the right we will consider MR arthrography.

## 2023-09-22 NOTE — Progress Notes (Signed)
    Procedures performed today:    None.  Independent interpretation of notes and tests performed by another provider:   None.  Brief History, Exam, Impression, and Recommendations:    Patellofemoral syndrome of both knees Doing much better, we did recommend VMO and hip abductor conditioning, she has improve dramatically but still has a ways to go until her left-sided hip abductors are congruent in strength with the right.  Complicated migraine Complicated migraines, she has established with a headache clinic. Nurtec was stopped and she was started on Emgality, she has not gotten the delivery yet, further management per headache clinic.   Chronic left hip pain As above doing much better with hip abductor conditioning, historically she did have a positive FADIR sign with negative x-rays, if symptoms persist and spite of aggressive hip abductor conditioning and strengthening for her left hip compared to the right we will consider MR arthrography.  Fatigue Excessive daytime fatigue. She does a history of Lyme disease, potentially chronic Lyme disease She paints a picture of fairly good sleep hygiene, she eats dinner at 5, in bed by 9, immediately sleep, no bright screens or caffeine past 1:00 PM. She sleeps through the night with no awakenings and awakes in the morning not feeling well rested. We did discuss a workup including routine labs as well as a sleep study, she will think about it. Ultimately if all of the above is performed and negative we can consider chronic fatigue syndrome and potentially using a stimulant.    ____________________________________________ Ihor Austin. Benjamin Stain, M.D., ABFM., CAQSM., AME. Primary Care and Sports Medicine [facility]  Adjunct Professor of Family Medicine  [employer]  Restaurant manager, fast food

## 2023-09-22 NOTE — Assessment & Plan Note (Signed)
Complicated migraines, she has established with a headache clinic. Nurtec was stopped and she was started on Emgality, she has not gotten the delivery yet, further management per headache clinic.

## 2023-10-10 DIAGNOSIS — Z789 Other specified health status: Secondary | ICD-10-CM | POA: Insufficient documentation

## 2023-10-10 DIAGNOSIS — R29898 Other symptoms and signs involving the musculoskeletal system: Secondary | ICD-10-CM | POA: Insufficient documentation

## 2023-10-10 DIAGNOSIS — M6289 Other specified disorders of muscle: Secondary | ICD-10-CM | POA: Insufficient documentation

## 2023-10-10 DIAGNOSIS — M5412 Radiculopathy, cervical region: Secondary | ICD-10-CM | POA: Insufficient documentation

## 2023-10-10 DIAGNOSIS — G4486 Cervicogenic headache: Secondary | ICD-10-CM | POA: Insufficient documentation

## 2023-10-28 ENCOUNTER — Other Ambulatory Visit (HOSPITAL_BASED_OUTPATIENT_CLINIC_OR_DEPARTMENT_OTHER): Payer: Self-pay

## 2023-10-28 MED ORDER — CLOBETASOL PROPIONATE 0.05 % EX OINT
TOPICAL_OINTMENT | CUTANEOUS | 3 refills | Status: AC
  Filled 2023-10-28: qty 30, 30d supply, fill #0

## 2023-11-10 ENCOUNTER — Ambulatory Visit: Payer: 59 | Admitting: Orthopaedic Surgery

## 2023-11-15 ENCOUNTER — Other Ambulatory Visit (HOSPITAL_BASED_OUTPATIENT_CLINIC_OR_DEPARTMENT_OTHER): Payer: Self-pay

## 2023-11-15 MED ORDER — TACROLIMUS 0.1 % EX OINT
1.0000 | TOPICAL_OINTMENT | Freq: Two times a day (BID) | CUTANEOUS | 0 refills | Status: DC
  Filled 2023-11-15: qty 30, 30d supply, fill #0

## 2024-05-21 ENCOUNTER — Encounter: Payer: Self-pay | Admitting: Dermatology

## 2024-05-21 ENCOUNTER — Ambulatory Visit (INDEPENDENT_AMBULATORY_CARE_PROVIDER_SITE_OTHER): Payer: 59 | Admitting: Dermatology

## 2024-05-21 VITALS — BP 107/74

## 2024-05-21 DIAGNOSIS — L309 Dermatitis, unspecified: Secondary | ICD-10-CM | POA: Diagnosis not present

## 2024-05-21 DIAGNOSIS — D2261 Melanocytic nevi of right upper limb, including shoulder: Secondary | ICD-10-CM | POA: Diagnosis not present

## 2024-05-21 DIAGNOSIS — D229 Melanocytic nevi, unspecified: Secondary | ICD-10-CM

## 2024-05-21 DIAGNOSIS — D225 Melanocytic nevi of trunk: Secondary | ICD-10-CM

## 2024-05-21 MED ORDER — CLOBETASOL PROPIONATE 0.05 % EX CREA
1.0000 | TOPICAL_CREAM | Freq: Two times a day (BID) | CUTANEOUS | 6 refills | Status: AC
Start: 2024-05-21 — End: ?

## 2024-05-21 NOTE — Patient Instructions (Addendum)
 Date: Mon May 21 2024  Hello Angela [name],  Thank you for visiting today. Here is a summary of the key instructions:  - Medications:   - Apply clobetasol  cream every morning and night for 2 weeks   - Use tacrolimus  ointment as a backup after 2 weeks if needed  - Skin Care:   - Apply lots of lotion after washing hands   - At night, apply clobetasol  cream and then a heavy moisturizer on top   - Consider using Vaseline on top at night to lock in moisture   - Try [employer]rogena [place] Hand Cream as a moisturizer  - Lifestyle Changes:   - Wear gloves when washing dishes and cleaning   - Limit contact with chemicals, dish soap, and cleaning products   - Apply sunscreen daily (SPF 30 or higher)   - Reapply sunscreen when at the beach or outside for long periods  - Follow-up: Return for a follow-up appointment in October  We look forward to seeing the positive changes in your next visit. If you have any questions or concerns before then, please do not hesitate to contact our office.  Warm regards,  Dr. Louana Roup, Dermatology            Recommended Sunscreen: [email or web address] ([email or web address])     Important Information   Due to recent changes in healthcare laws, you may see results of your pathology and/or laboratory studies on MyChart before the doctors have had a chance to review them. We understand that in some cases there may be results that are confusing or concerning to you. Please understand that not all results are received at the same time and often the doctors may need to interpret multiple results in order to provide you with the best plan of care or course of treatment. Therefore, we ask that you please give us  2 business days to thoroughly review all your results before contacting the office for clarification. Should we see a critical lab result, you will be contacted sooner.     If You Need Anything After Your Visit   If you have any questions or concerns for your doctor,  please call our main line at [phone]. If no one answers, please leave a voicemail as directed and we will return your call as soon as possible. Messages left after 4 pm will be answered the following business day.    You may also send us  a message via MyChart. We typically respond to MyChart messages within 1-2 business days.  For prescription refills, please ask your pharmacy to contact our office. Our fax number is [phone].  If you have an urgent issue when the clinic is closed that cannot wait until the next business day, you can page your doctor at the number below.     Please note that while we do our best to be available for urgent issues outside of office hours, we are not available 24/7.    If you have an urgent issue and are unable to reach us , you may choose to seek medical care at your doctor's office, retail clinic, urgent care center, or emergency room.   If you have a medical emergency, please immediately call 911 or go to the emergency department. In the event of inclement weather, please call our main line at [phone] for an update on the status of any delays or closures.  Dermatology Medication Tips: Please keep the boxes that topical medications come in in order to help keep track of the  instructions about where and how to use these. Pharmacies typically print the medication instructions only on the boxes and not directly on the medication tubes.   If your medication is too expensive, please contact our office at [phone] or send us  a message through MyChart.    We are unable to tell what your co-pay for medications will be in advance as this is different depending on your insurance coverage. However, we may be able to find a substitute medication at lower cost or fill out paperwork to get insurance to cover a needed medication.    If a prior authorization is required to get your medication covered by your insurance company, please allow us  1-2 business days  to complete this process.   Drug prices often vary depending on where the prescription is filled and some pharmacies may offer cheaper prices.   The website www.goodrx.com contains coupons for medications through different pharmacies. The prices here do not account for what the cost may be with help from insurance (it may be cheaper with your insurance), but the website can give you the price if you did not use any insurance.  - You can print the associated coupon and take it with your prescription to the pharmacy.  - You may also stop by our office during regular business hours and pick up a GoodRx coupon card.  - If you need your prescription sent electronically to a different pharmacy, notify our office through [facility] or by phone at [phone]

## 2024-05-21 NOTE — Progress Notes (Signed)
   New Patient Visit   Subjective  Angela [name] is a 23 y.o. female who presents for the following: Eczema and sun spots  Patient states she has eczema and sun spots located at the arms (Sun spots) and hands (Eczema) that she would like to have examined. Patient reports the areas have been there for 9 years. She reports the areas are bothersome. She reports the eczema is itchy ,burning and painful. Patient rates irritation 5 out of 10. Patient reports she has previously been treated for these areas. Patient has previously tried and failed Triamcinolone, Clobetasol  and Tacrolimus . Patient has also tried Dupixent injections for about 2 months and then she stopped receiving her shipments.   Patient reports the sun spots are not bothersome and are not changing.   The following portions of the chart were reviewed this encounter and updated as appropriate: medications, allergies, medical history  Review of Systems:  No other skin or systemic complaints except as noted in HPI or Assessment and Plan.  Objective  Well appearing patient in no apparent distress; mood and affect are within normal limits.  A focused examination was performed of the following areas: Hand and RUE  Relevant exam findings are noted in the Assessment and Plan.                         Assessment & Plan   HAND DERMATITIS Exam: Scaly pink plaques +/- fissures BSA: IGA:  not at goal  - Assessment:  Patient has a history of hand eczema with flares in both winter and summer. Previously on Dupixent for 2 months, which was helpful, but discontinued due to issues with prescription refills. Currently, eczema is well-controlled with alternating clobetasol  and tacrolimus  applied approximately once weekly. Patient reports this is the best her hands have ever looked. However, she admits to inconsistent application of medications due to forgetfulness and dislike of the oily texture.  Hand Dermatitis is a chronic type  of eczema that can come and go on the hands and fingers.  While there is no cure, the rash and symptoms can be managed with topical prescription medications, and for more severe cases, with systemic medications.  Recommend mild soap and routine use of moisturizing cream after handwashing.  Minimize soap/water exposure when possible.    Treatment Plan: - Recommend mild soap and moisturizing cream with hand washing. - Prescibed Clobetasol  Cream to apply 2 timers daily for 2 weeks then alternate with Tacrolimus  - Advised if topicals are ineffective we will plan to restart Dupixent - Plan to follow up in 4 months   MELANOCYTIC NEVI Exam: 6 mm located on right chest & 2 mm On Right Antecubital Fossa Tan-brown and/or pink-flesh-colored symmetric macules and papules  - Assessment: Patient presents with concerns about moles that appeared after a severe sunburn.  Treatment Plan: Benign appearing on exam today. Recommend observation. Call clinic for new or changing moles. Recommend daily use of broad spectrum spf 30+ sunscreen to sun-exposed areas.     No follow-ups on file.  I, Jetta Ager, am acting as Neurosurgeon for [employer], DO.   Documentation: I have reviewed the above documentation for accuracy and completeness, and I agree with the above.  Louana Roup, DO

## 2024-07-10 ENCOUNTER — Encounter: Payer: Self-pay | Admitting: Dermatology

## 2024-07-11 NOTE — Telephone Encounter (Signed)
 Please reach out to patient and let them know they need an appointment to restart dupixent

## 2024-08-07 ENCOUNTER — Encounter: Payer: Self-pay | Admitting: Sports Medicine

## 2024-10-03 ENCOUNTER — Other Ambulatory Visit (HOSPITAL_BASED_OUTPATIENT_CLINIC_OR_DEPARTMENT_OTHER): Payer: Self-pay

## 2024-10-03 MED ORDER — DIVALPROEX SODIUM 500 MG PO DR TAB
500.0000 mg | DELAYED_RELEASE_TABLET | Freq: Two times a day (BID) | ORAL | 1 refills | Status: DC
  Filled 2024-10-03: qty 20, 10d supply, fill #0

## 2024-10-08 ENCOUNTER — Encounter: Payer: Self-pay | Admitting: Radiology

## 2024-10-09 ENCOUNTER — Ambulatory Visit: Admitting: Dermatology

## 2024-10-12 ENCOUNTER — Other Ambulatory Visit (HOSPITAL_BASED_OUTPATIENT_CLINIC_OR_DEPARTMENT_OTHER): Payer: Self-pay

## 2024-10-12 MED ORDER — GABAPENTIN 100 MG PO CAPS
ORAL_CAPSULE | ORAL | 5 refills | Status: DC
  Filled 2024-10-12: qty 120, 33d supply, fill #0

## 2025-02-18 ENCOUNTER — Ambulatory Visit: Admitting: Dermatology
